# Patient Record
Sex: Male | Born: 1965 | Race: White | Hispanic: No | Marital: Married | Smoking: Former smoker
Health system: Southern US, Community
[De-identification: ages and names within clinical notes are randomized; demographics above are authoritative.]

## PROBLEM LIST (undated history)

## (undated) DIAGNOSIS — E785 Hyperlipidemia, unspecified: Secondary | ICD-10-CM

## (undated) DIAGNOSIS — H269 Unspecified cataract: Secondary | ICD-10-CM

## (undated) DIAGNOSIS — G4730 Sleep apnea, unspecified: Secondary | ICD-10-CM

## (undated) HISTORY — DX: Sleep apnea, unspecified: G47.30

## (undated) HISTORY — PX: OTHER SURGICAL HISTORY: SHX169

## (undated) HISTORY — PX: CATARACT EXTRACTION: SUR2

## (undated) HISTORY — DX: Hyperlipidemia, unspecified: E78.5

## (undated) HISTORY — PX: POLYPECTOMY: SHX149

## (undated) HISTORY — PX: UPPER GASTROINTESTINAL ENDOSCOPY: SHX188

## (undated) HISTORY — DX: Unspecified cataract: H26.9

---

## 2015-12-04 HISTORY — PX: COLONOSCOPY: SHX174

## 2015-12-04 LAB — HM COLONOSCOPY

## 2018-08-19 ENCOUNTER — Encounter: Payer: Self-pay | Admitting: Gastroenterology

## 2018-09-14 ENCOUNTER — Encounter: Payer: Self-pay | Admitting: Gastroenterology

## 2018-09-15 ENCOUNTER — Ambulatory Visit (INDEPENDENT_AMBULATORY_CARE_PROVIDER_SITE_OTHER): Payer: 59 | Admitting: Gastroenterology

## 2018-09-15 ENCOUNTER — Encounter: Payer: Self-pay | Admitting: Gastroenterology

## 2018-09-15 VITALS — BP 148/90 | HR 85 | Ht 75.0 in | Wt 277.8 lb

## 2018-09-15 DIAGNOSIS — K219 Gastro-esophageal reflux disease without esophagitis: Secondary | ICD-10-CM | POA: Diagnosis not present

## 2018-09-15 DIAGNOSIS — R1310 Dysphagia, unspecified: Secondary | ICD-10-CM | POA: Diagnosis not present

## 2018-09-15 MED ORDER — PANTOPRAZOLE SODIUM 40 MG PO TBEC: 40.0000 mg | DELAYED_RELEASE_TABLET | Freq: Every day | ORAL | 3 refills | Status: DC

## 2018-09-15 NOTE — Patient Instructions (Signed)
If you are age 53 or older, your body mass index should be between 23-30. Your Body mass index is 34.72 kg/m. If this is out of the aforementioned range listed, please consider follow up with your Primary Care Provider.  If you are age 78 or younger, your body mass index should be between 19-25. Your Body mass index is 34.72 kg/m. If this is out of the aformentioned range listed, please consider follow up with your Primary Care Provider.   You have been scheduled for a Barium Esophogram at [facility] Radiology (1st floor of the hospital) on 10/27/18 at 11am. Please arrive 15 minutes prior to your appointment for registration. Make certain not to have anything to eat or drink 3 hours prior to your test. If you need to reschedule for any reason, please contact radiology at [phone] to do so. __________________________________________________________________ A barium swallow is an examination that concentrates on views of the esophagus. This tends to be a double contrast exam (barium and two liquids which, when combined, create a gas to distend the wall of the oesophagus) or single contrast (non-ionic iodine based). The study is usually tailored to your symptoms so a good history is essential. Attention is paid during the study to the form, structure and configuration of the esophagus, looking for functional disorders (such as aspiration, dysphagia, achalasia, motility and reflux) EXAMINATION You may be asked to change into a gown, depending on the type of swallow being performed. A radiologist and radiographer will perform the procedure. The radiologist will advise you of the type of contrast selected for your procedure and direct you during the exam. You will be asked to stand, sit or lie in several different positions and to hold a small amount of fluid in your mouth before being asked to swallow while the imaging is performed .In some instances you may be asked to swallow barium coated marshmallows  to assess the motility of a solid food bolus. The exam can be recorded as a digital or video fluoroscopy procedure. POST PROCEDURE It will take 1-2 days for the barium to pass through your system. To facilitate this, it is important, unless otherwise directed, to increase your fluids for the next 24-48hrs and to resume your normal diet.  This test typically takes about 30 minutes to perform. __________________________________________________________________________________   Joshua Crawford have been scheduled for an endoscopy. Please follow written instructions given to you at your visit today. If you use inhalers (even only as needed), please bring them with you on the day of your procedure. Your physician has requested that you go to www.startemmi.com and enter the access code given to you at your visit today. This web site gives a general overview about your procedure. However, you should still follow specific instructions given to you by our office regarding your preparation for the procedure.  Thank you,  Dr. Jackquline Denmark

## 2018-09-15 NOTE — Progress Notes (Signed)
Chief Complaint: Esophageal dysphagia  Referring Provider:  Darrol Jump, PA-C      ASSESSMENT AND PLAN;   #1.  GERD   #2. Esophageal dysphagia. Differential diagnoses includes esophageal stricture, Schatzki's ring, motility disorder, eosinophilic esophagitis, pill induced esophagitis, rule out esophageal carcinoma or extrinsic lesions. Plan: - Ba swallow with barium tablet - Protonix 40mg  po qd. - EGD with dilatation - I have instructed patient that she needs to chew foods especially meats and breads well and eat slowly.     HPI:    Joshua Crawford is a 52 y.o. male  Intermittent solid food dysphagia mainly to beef over the last 2 years Mainly in the upper chest With occasional heartburn and regurgitation. Heartburn intermittently has been for the last several years. No weight loss, hemoptysis No problems with liquids No nausea or vomiting  Diagnosed with sleep apnea 2 years ago-has been on CPAP Had colonoscopy as a part of colorectal cancer screening December 2016.  Hyperplastic colonic polyp.  Repeat in 10 years.  Earlier, if any new problems  Past Surgical History:  Procedure Laterality Date  . CATARACT EXTRACTION    . COLONOSCOPY  12/04/2015   Colonic polyp status post polypectomy. Small internal hemorrhoids. Hypoerplastic polyp.   . Left Hand/Wrist Fracture w/ surgical repair    . Right Knee Arthroscopy    . Right Rotator Cuff Repair      Family History  Problem Relation Age of Onset  . Ovarian cancer Mother   . Lymphoma Sister   . Liver cancer Paternal Uncle     Social History   Tobacco Use  . Smoking status: Former Research scientist (life sciences)  . Smokeless tobacco: Never Used  Substance Use Topics  . Alcohol use: Yes    Comment: 0-3 daily  . Drug use: Never    No current outpatient medications on file.   No current facility-administered medications for this visit.     Allergies  Allergen Reactions  . Aleve [Naproxen Sodium] Anaphylaxis    Review of  Systems:  Constitutional: Denies fever, chills, diaphoresis, appetite change and fatigue.  HEENT: Denies photophobia, eye pain, redness, hearing loss, ear pain, congestion, sore throat, rhinorrhea, sneezing, mouth sores, neck pain, neck stiffness and tinnitus.   Respiratory: Denies SOB, DOE, cough, chest tightness,  and wheezing.   Cardiovascular: Denies chest pain, palpitations and leg swelling.  Genitourinary: Denies dysuria, urgency, frequency, hematuria, flank pain and difficulty urinating.  Musculoskeletal: Denies myalgias, has back pain, no joint swelling, arthralgias and gait problem.  Skin: No rash.  Neurological: Denies dizziness, seizures, syncope, weakness, light-headedness, numbness and headaches.  Hematological: Denies adenopathy. Easy bruising, personal or family bleeding history  Psychiatric/Behavioral: No anxiety or depression     Physical Exam:    BP (!) 148/90   Pulse 85   Ht 6\' 3"  (1.905 m)   Wt 277 lb 12.8 oz (126 kg)   BMI 34.72 kg/m  Filed Weights   09/15/18 0905  Weight: 277 lb 12.8 oz (126 kg)   Constitutional:  Well-developed, in no acute distress. Psychiatric: Normal mood and affect. Behavior is normal. HEENT: Pupils normal.  Conjunctivae are normal. No scleral icterus. Neck supple.  Cardiovascular: Normal rate, regular rhythm. No edema Pulmonary/chest: Effort normal and breath sounds normal. No wheezing, rales or rhonchi. Abdominal: Soft, nondistended. Nontender. Bowel sounds active throughout. There are no masses palpable. No hepatomegaly. Rectal:  defered Neurological: Alert and oriented to person place and time. Skin: Skin is warm and dry. No rashes noted.  Carmell Austria, MD 09/15/2018, 9:14 AM  Cc: Darrol Jump, PA-C

## 2018-09-29 ENCOUNTER — Ambulatory Visit (HOSPITAL_COMMUNITY)
Admission: RE | Admit: 2018-09-29 | Discharge: 2018-09-29 | Disposition: A | Discharge status: Home / Self Care | Payer: 59 | Source: Ambulatory Visit | Attending: Gastroenterology | Admitting: Gastroenterology

## 2018-09-29 DIAGNOSIS — R1310 Dysphagia, unspecified: Secondary | ICD-10-CM | POA: Insufficient documentation

## 2018-09-29 DIAGNOSIS — K219 Gastro-esophageal reflux disease without esophagitis: Secondary | ICD-10-CM | POA: Insufficient documentation

## 2018-10-13 ENCOUNTER — Encounter: Payer: Self-pay | Admitting: Gastroenterology

## 2018-10-27 ENCOUNTER — Encounter: Payer: Self-pay | Admitting: Gastroenterology

## 2018-10-27 ENCOUNTER — Ambulatory Visit (AMBULATORY_SURGERY_CENTER): Payer: 59 | Admitting: Gastroenterology

## 2018-10-27 VITALS — BP 123/89 | HR 75 | Temp 97.8°F | Resp 18 | Ht 75.0 in | Wt 277.0 lb

## 2018-10-27 DIAGNOSIS — R1319 Other dysphagia: Secondary | ICD-10-CM

## 2018-10-27 DIAGNOSIS — K2970 Gastritis, unspecified, without bleeding: Secondary | ICD-10-CM | POA: Diagnosis not present

## 2018-10-27 DIAGNOSIS — K200 Eosinophilic esophagitis: Secondary | ICD-10-CM | POA: Diagnosis not present

## 2018-10-27 MED ORDER — SODIUM CHLORIDE 0.9 % IV SOLN: 500.0000 mL | Freq: Once | INTRAVENOUS | Status: DC

## 2018-10-27 NOTE — Op Note (Signed)
[facility] Patient Name: Joshua Crawford Procedure Date: 10/27/2018 3:38 PM MRN: [id number] Endoscopist: Jackquline Denmark , MD Age: 52 Referring MD:  Date of Birth: Jun 21, 1966 Gender: Male Account #: [id number] Procedure:                Upper GI endoscopy Indications:              Dysphagia Medicines:                Monitored Anesthesia Care Procedure:                Pre-Anesthesia Assessment:                           - Prior to the procedure, a History and Physical                            was performed, and patient medications and                            allergies were reviewed. The patient's tolerance of                            previous anesthesia was also reviewed. The risks                            and benefits of the procedure and the sedation                            options and risks were discussed with the patient.                            All questions were answered, and informed consent                            was obtained. Prior Anticoagulants: The patient has                            taken no previous anticoagulant or antiplatelet                            agents. ASA Grade Assessment: II - A patient with                            mild systemic disease. After reviewing the risks                            and benefits, the patient was deemed in                            satisfactory condition to undergo the procedure.                           After obtaining informed consent, the endoscope was  passed under direct vision. Throughout the                            procedure, the patient's blood pressure, pulse, and                            oxygen saturations were monitored continuously. The                            Endoscope was introduced through the mouth, and                            advanced to the second part of duodenum. The upper                            GI endoscopy was accomplished without difficulty.                             The patient tolerated the procedure well. Findings:                 Mucosal changes including feline appearance and                            longitudinal furrows were found in the entire                            esophagus. Biopsies were obtained from the proximal                            and distal esophagus with cold forceps for                            histology of suspected eosinophilic esophagitis.                            Estimated blood loss was minimal. The scope was                            withdrawn. Dilation was performed with a Maloney                            dilator with no resistance at 50 Fr. Estimated                            blood loss: none.                           Mild inflammation was found in the gastric antrum.                            Biopsies were taken with a cold forceps for                            histology.  The examined duodenum was normal. Complications:            No immediate complications. Estimated Blood Loss:     Estimated blood loss: none. Impression:               - Esophageal mucosal changes suggestive of                            eosinophilic esophagitis. Biopsied. Dilated.                           - Mild gastritis. Biopsied. Recommendation:           - Post dilatation diet.                           - Continue present medications. Continue Protonix                            40 mg p.o. once a day.                           - Await pathology results.                           - Return to GI clinic PRN. Jackquline Denmark, MD 10/27/2018 4:11:26 PM This report has been signed electronically.

## 2018-10-27 NOTE — Progress Notes (Signed)
To PACU, VSS. Report to [email or web address] 

## 2018-10-27 NOTE — Patient Instructions (Signed)
Handouts given on dilation diet, gastritis, esophagitis and stricture, GERD protocol.    YOU HAD AN ENDOSCOPIC PROCEDURE TODAY AT [facility]:   Refer to the procedure report that was given to you for any specific questions about what was found during the examination.  If the procedure report does not answer your questions, please call your gastroenterologist to clarify.  If you requested that your care partner not be given the details of your procedure findings, then the procedure report has been included in a sealed envelope for you to review at your convenience later.  YOU SHOULD EXPECT: Some feelings of bloating in the abdomen. Passage of more gas than usual.  Walking can help get rid of the air that was put into your GI tract during the procedure and reduce the bloating. If you had a lower endoscopy (such as a colonoscopy or flexible sigmoidoscopy) you may notice spotting of blood in your stool or on the toilet paper. If you underwent a bowel prep for your procedure, you may not have a normal bowel movement for a few days.  Please Note:  You might notice some irritation and congestion in your nose or some drainage.  This is from the oxygen used during your procedure.  There is no need for concern and it should clear up in a day or so.  SYMPTOMS TO REPORT IMMEDIATELY:    Following upper endoscopy (EGD)  Vomiting of blood or coffee ground material  New chest pain or pain under the shoulder blades  Painful or persistently difficult swallowing  New shortness of breath  Fever of 100F or higher  Black, tarry-looking stools  For urgent or emergent issues, a gastroenterologist can be reached at any hour by calling [phone].   DIET:  We do recommend a small meal at first, but then you may proceed to your regular diet.  Drink plenty of fluids but you should avoid alcoholic beverages for 24 hours.  ACTIVITY:  You should plan to take it easy for the rest of today and you  should NOT DRIVE or use heavy machinery until tomorrow (because of the sedation medicines used during the test).    FOLLOW UP: Our staff will call the number listed on your records the next business day following your procedure to check on you and address any questions or concerns that you may have regarding the information given to you following your procedure. If we do not reach you, we will leave a message.  However, if you are feeling well and you are not experiencing any problems, there is no need to return our call.  We will assume that you have returned to your regular daily activities without incident.  If any biopsies were taken you will be contacted by phone or by letter within the next 1-3 weeks.  Please call us at [phone] if you have not heard about the biopsies in 3 weeks.    SIGNATURES/CONFIDENTIALITY: You and/or your care partner have signed paperwork which will be entered into your electronic medical record.  These signatures attest to the fact that that the information above on your After Visit Summary has been reviewed and is understood.  Full responsibility of the confidentiality of this discharge information lies with you and/or your care-partner.

## 2018-10-27 NOTE — Progress Notes (Signed)
Called to room to assist during endoscopic procedure.  Patient ID and intended procedure confirmed with present staff. Received instructions for my participation in the procedure from the performing physician.  

## 2018-10-28 ENCOUNTER — Telehealth: Payer: Self-pay | Admitting: *Deleted

## 2018-10-28 NOTE — Telephone Encounter (Signed)
  Follow up Call-  Call back number 10/27/2018  Post procedure Call Back phone  # [phone]  Permission to leave phone message Yes     Patient questions:  Do you have a fever, pain , or abdominal swelling? No. Pain Score  0 *  Have you tolerated food without any problems? Yes.    Have you been able to return to your normal activities? Yes.    Do you have any questions about your discharge instructions: Diet   No. Medications  No. Follow up visit  No.  Do you have questions or concerns about your Care? No.  Actions: * If pain score is 4 or above: No action needed, pain <4.

## 2018-11-02 ENCOUNTER — Encounter: Payer: Self-pay | Admitting: Gastroenterology

## 2018-11-03 ENCOUNTER — Other Ambulatory Visit: Payer: Self-pay

## 2018-11-03 DIAGNOSIS — A048 Other specified bacterial intestinal infections: Secondary | ICD-10-CM

## 2018-11-03 MED ORDER — CLARITHROMYCIN 500 MG PO TABS: 500.0000 mg | ORAL_TABLET | Freq: Two times a day (BID) | ORAL | 0 refills | Status: DC

## 2018-11-03 MED ORDER — PANTOPRAZOLE SODIUM 40 MG PO TBEC: 40.0000 mg | DELAYED_RELEASE_TABLET | Freq: Two times a day (BID) | ORAL | 0 refills | Status: DC

## 2018-11-03 MED ORDER — AMOXICILLIN 500 MG PO TABS: 1000.0000 mg | ORAL_TABLET | Freq: Two times a day (BID) | ORAL | 0 refills | Status: DC

## 2018-11-03 MED ORDER — METRONIDAZOLE 500 MG PO TABS: 500.0000 mg | ORAL_TABLET | Freq: Two times a day (BID) | ORAL | 0 refills | Status: DC

## 2018-11-18 ENCOUNTER — Telehealth: Payer: Self-pay | Admitting: Gastroenterology

## 2018-11-18 NOTE — Telephone Encounter (Signed)
Called and spoke with patient- patient reports he has finished all of the antibiotic and was told he had to give a stool sample; order in Epic already for H. pylori; patient informed he could come to the [facility] basement level to complete the lab work requested by MD; patient verbalized understanding of instructions and information; patient advise to call back if questions/concerns arise;

## 2018-11-18 NOTE — Telephone Encounter (Signed)
Pt called to inform that he finished treatment for H-pylori yesterday, He wants to schedule stool study.

## 2018-11-23 ENCOUNTER — Other Ambulatory Visit: Payer: 59

## 2018-11-23 DIAGNOSIS — A048 Other specified bacterial intestinal infections: Secondary | ICD-10-CM

## 2018-11-25 LAB — H. PYLORI ANTIGEN, STOOL: H pylori Ag, Stl: NEGATIVE

## 2020-02-21 ENCOUNTER — Telehealth: Payer: Self-pay | Admitting: Gastroenterology

## 2020-03-04 ENCOUNTER — Encounter: Payer: Self-pay | Admitting: Gastroenterology

## 2020-03-04 NOTE — Telephone Encounter (Signed)
Per Dr. Lyndel Safe after reviewing last colonoscopy report, patient is due December 2021. I have called and left a message for patient.

## 2020-08-16 IMAGING — RF DG ESOPHAGUS
9 series · 21 of 24 positions shown · non-contrast
Comparison: None.

CLINICAL DATA: Unspecified dysphagia

EXAM:
ESOPHOGRAM / BARIUM SWALLOW / BARIUM TABLET STUDY
TECHNIQUE: Combined double contrast and single contrast examination performed
using effervescent crystals, thick barium liquid, and thin barium
liquid. The patient was observed with fluoroscopy swallowing a 13 mm
barium sulphate tablet.
FLUOROSCOPY TIME:  Fluoroscopy Time:  1.3 minutes
Radiation Exposure Index (if provided by the fluoroscopic device):
14.4 mGy
Number of Acquired Spot Images: 0

[Series 1: cp_standard · 0.34mm/px · 3 of 38 frames shown (1 of 9)]
[frame 6/38]
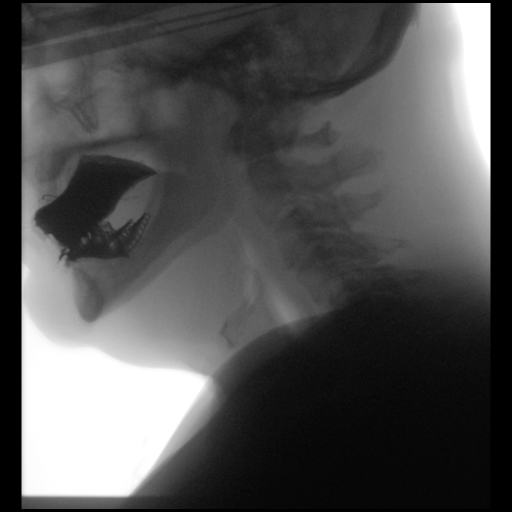
[frame 33/38]
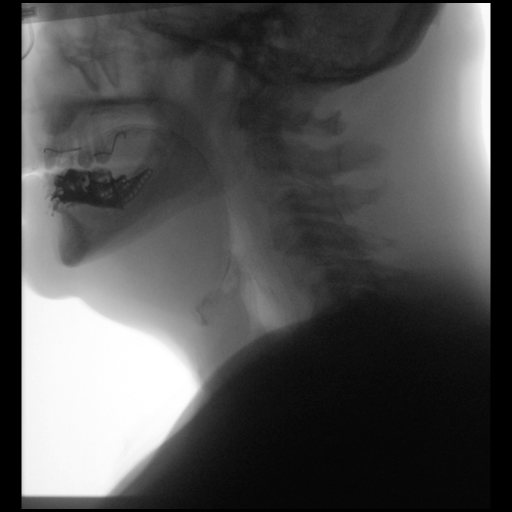
[frame 34/38]
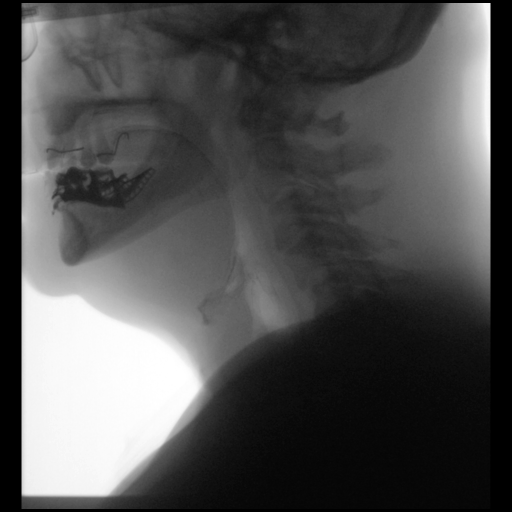

[Series 2: cp_standard · 0.34mm/px · 1 of 53 frames shown (2 of 9)]
[frame 27/53]
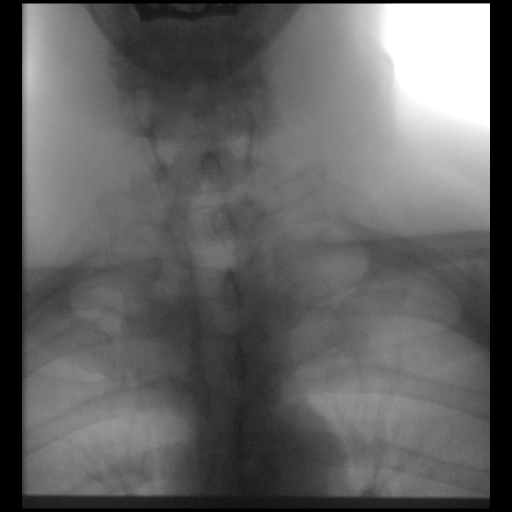

[Series 3: cp_standard · 0.51mm/px · 3 of 22 frames shown (3 of 9)]
[frame 4/22]
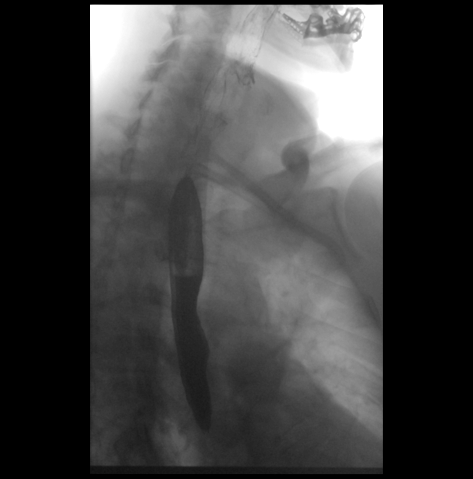
[frame 7/22]
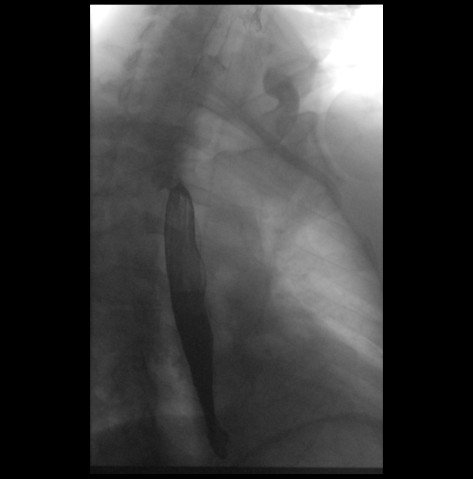
[frame 19/22]
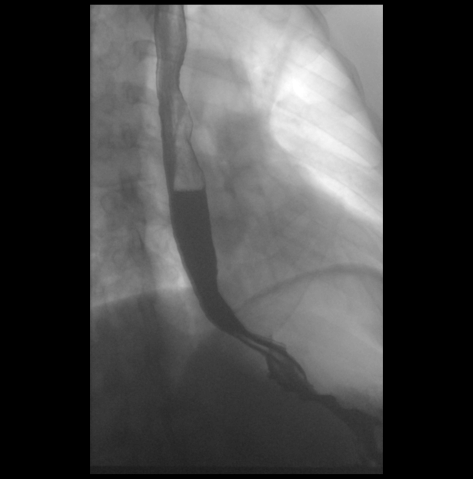

[Series 4: cp_standard · 0.34mm/px · 3 of 14 frames shown (4 of 9)]
[frame 3/14]
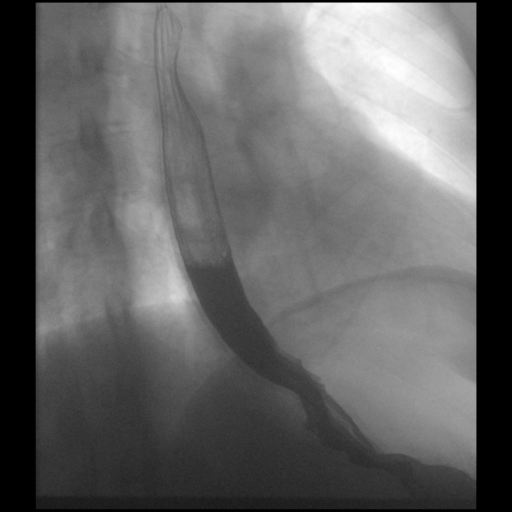
[frame 8/14]
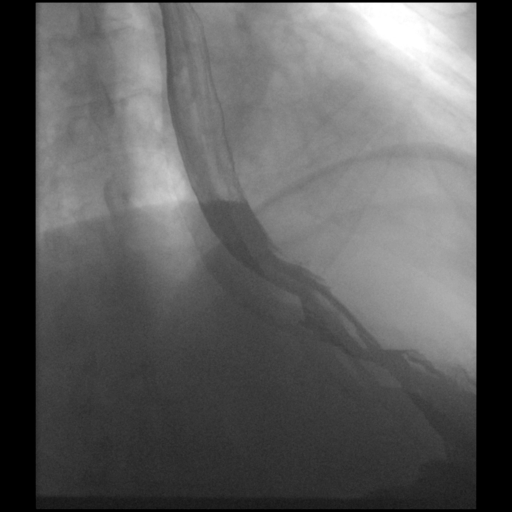
[frame 12/14]
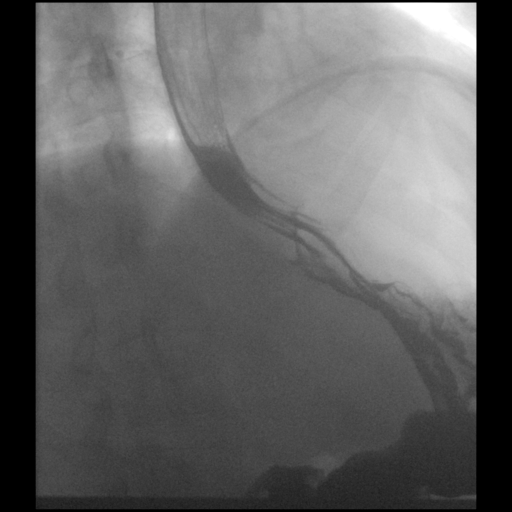

[Series 5: cp_standard · 0.34mm/px · 1 of 22 frames shown (5 of 9)]
[frame 12/22]
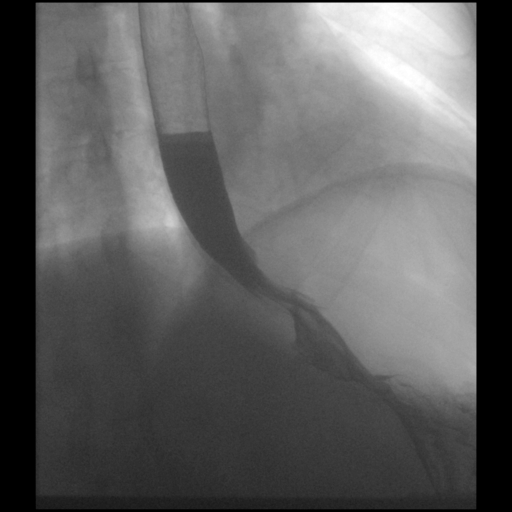

[Series 6: cp_standard · 0.34mm/px · 3 of 5 frames shown (6 of 9)]
[frame 1/5]
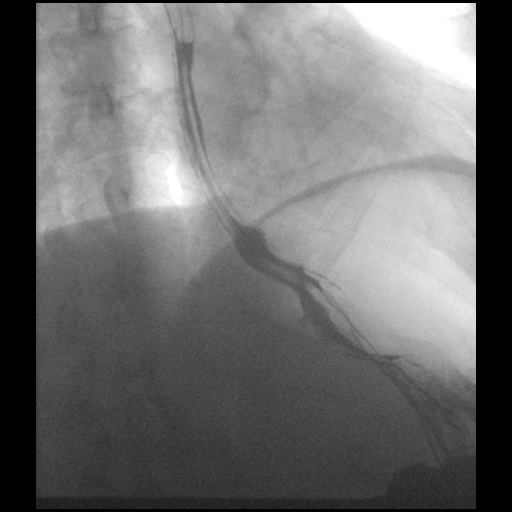
[frame 2/5]
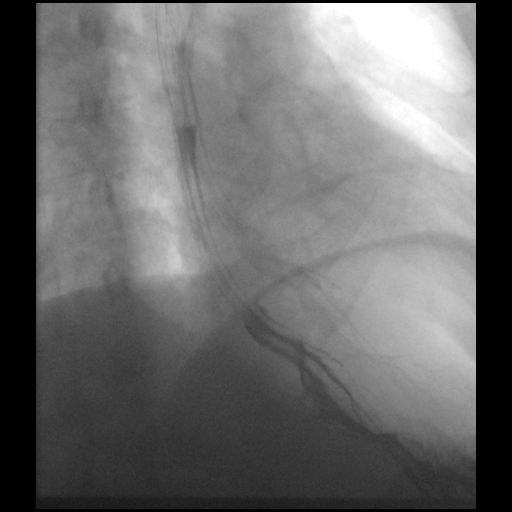
[frame 5/5]
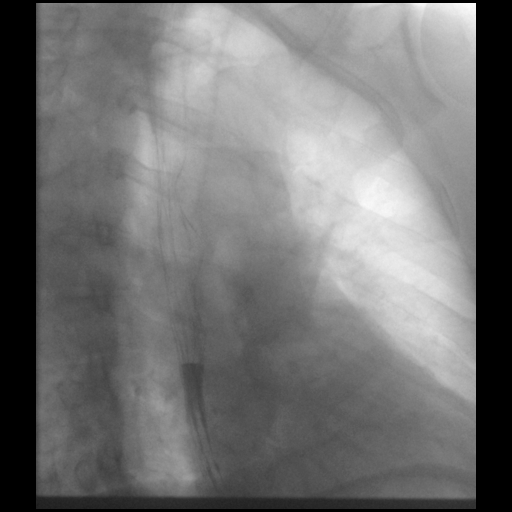

[Series 7: cp_standard · 0.53mm/px · 3 of 31 frames shown (7 of 9)]
[frame 3/31]
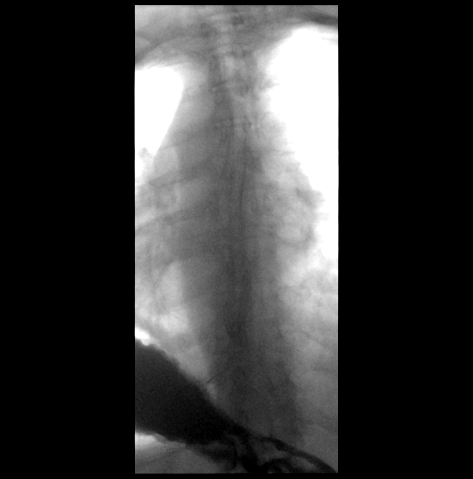
[frame 16/31]
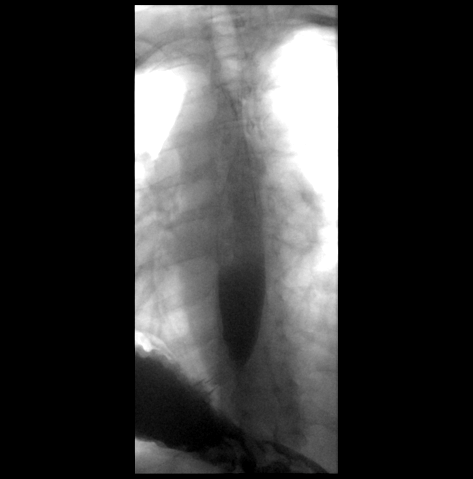
[frame 27/31]
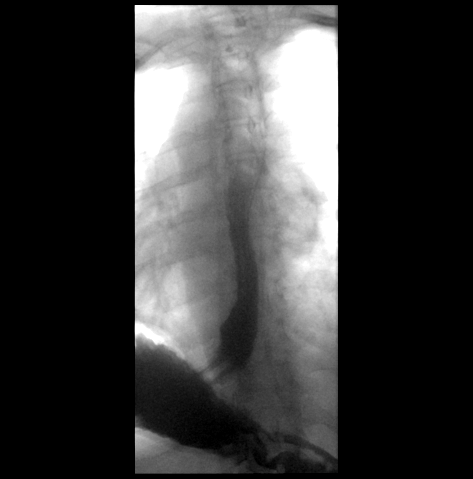

[Series 8: cp_standard · 0.53mm/px · 1 of 21 frames shown (8 of 9)]
[frame 18/21]
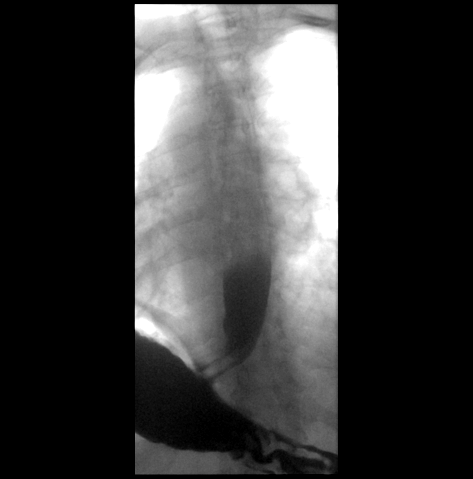

[Series 9: cp_standard · 0.54mm/px · 3 of 30 frames shown (9 of 9)]
[frame 5/30]
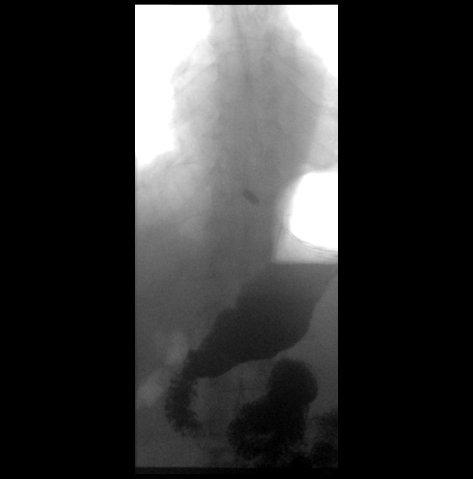
[frame 16/30]
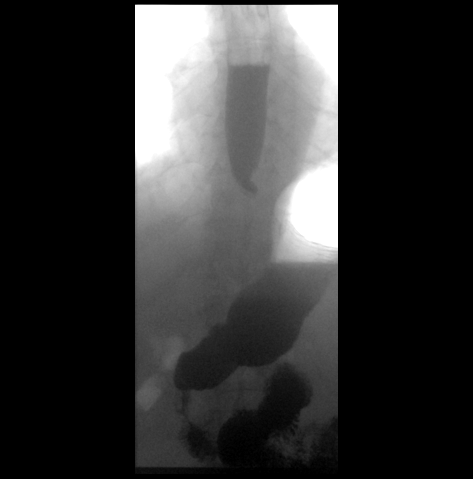
[frame 28/30]
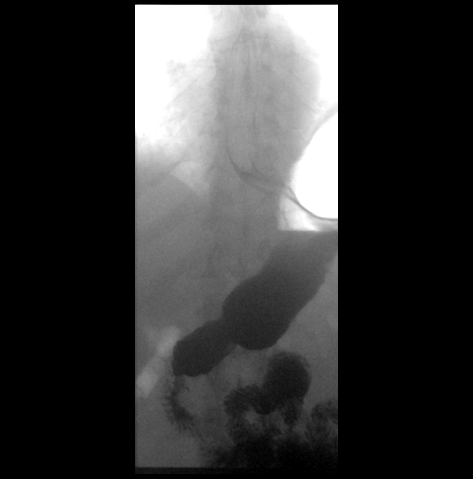

[21 of 24 positions shown; findings below may reference images not displayed]

FINDINGS: Good oral coordination. There is flash laryngeal penetration. No
stasis. No obstructive process is seen. There is asymmetric right
lateral pharyngeal outpouching without fixed diverticulum.

The esophagus has normal distensibility and smooth mucosal contour.
Normal motility. No hiatal hernia.

A 13 mm barium tablet transiently arrested at the GE junction,
without evidence of underlying cause. This eventually passed with
swallows.
IMPRESSION: Transient delay of 13 mm barium tablet at the GE junction, which
reproduces symptoms, but is not associated with a fixed narrowing or
dysmotility.

## 2020-10-02 ENCOUNTER — Other Ambulatory Visit: Payer: Self-pay

## 2020-10-02 ENCOUNTER — Ambulatory Visit (INDEPENDENT_AMBULATORY_CARE_PROVIDER_SITE_OTHER): Payer: 59 | Admitting: Family Medicine

## 2020-10-02 ENCOUNTER — Encounter: Payer: Self-pay | Admitting: Family Medicine

## 2020-10-02 VITALS — BP 128/78 | HR 96 | Temp 98.7°F | Ht 75.0 in | Wt 288.0 lb

## 2020-10-02 DIAGNOSIS — M545 Low back pain, unspecified: Secondary | ICD-10-CM

## 2020-10-02 DIAGNOSIS — E119 Type 2 diabetes mellitus without complications: Secondary | ICD-10-CM | POA: Insufficient documentation

## 2020-10-02 DIAGNOSIS — E669 Obesity, unspecified: Secondary | ICD-10-CM | POA: Insufficient documentation

## 2020-10-02 DIAGNOSIS — I152 Hypertension secondary to endocrine disorders: Secondary | ICD-10-CM

## 2020-10-02 DIAGNOSIS — E1169 Type 2 diabetes mellitus with other specified complication: Secondary | ICD-10-CM | POA: Diagnosis not present

## 2020-10-02 DIAGNOSIS — E785 Hyperlipidemia, unspecified: Secondary | ICD-10-CM | POA: Diagnosis not present

## 2020-10-02 DIAGNOSIS — E1159 Type 2 diabetes mellitus with other circulatory complications: Secondary | ICD-10-CM | POA: Insufficient documentation

## 2020-10-02 HISTORY — DX: Low back pain, unspecified: M54.50

## 2020-10-02 HISTORY — DX: Type 2 diabetes mellitus with other specified complication: I15.2

## 2020-10-02 HISTORY — DX: Type 2 diabetes mellitus with other circulatory complications: E66.9

## 2020-10-02 MED ORDER — CYCLOBENZAPRINE HCL 10 MG PO TABS: 10.0000 mg | ORAL_TABLET | Freq: Every day | ORAL | 0 refills | Status: DC

## 2020-10-02 MED ORDER — ATORVASTATIN CALCIUM 10 MG PO TABS: 10.0000 mg | ORAL_TABLET | Freq: Every day | ORAL | 1 refills | Status: DC

## 2020-10-02 NOTE — Progress Notes (Signed)
Acute Office Visit  Subjective:    Patient ID: Joshua Crawford, male    DOB: 1966-06-20, 54 y.o.   MRN: [id number]  Chief Complaint  Patient presents with  . LOW BACK PAIN    HPI Patient is in today for back pain-pt reached in drawer to get a pair of socks and his back popped(yesterday). Pt with acute pain-pt states laid on bed then he was able to get up and walk around. Pt states no radiation of pain into the legs. NO burning pain. Pt able to sleep laying down last night. Wife called on-line doctor from insurance and they called in a dose pack for treatment. Pt took 6 last night. No h/o of ulcer disease or hypertension(dose pack -taper)-pt states he just took them at the same time.   Pt states he had an injury to his back 20 years ago-workers comp. Pt feel and landed on his head. Pt seen by surgery who gave him 50/50 chance of successful pain free post operation -he decided not to undergo surgery. MRI on his back 20 years ago-no findings. No numbness or tingling noted with his back pain. Pt states he took a muscle relaxer in the past-pt has not taken on a regular basis.  pts wife gave him one of her muscle relaxers last night. Unsure on name-? Flexeril-5 mgs. . 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 Pt states he does not complete heavy lifting at work.   Pt off cholesterol medications-stopped 6 months ago Pt due to PE in 3 weeks-needs blood work-none recently   Past Medical History:  Diagnosis Date  . Hyperlipidemia     Past Surgical History:  Procedure Laterality Date  . CATARACT EXTRACTION    . COLONOSCOPY  12/04/2015   Colonic polyp status post polypectomy. Small internal hemorrhoids. Hypoerplastic polyp.   . Left Hand/Wrist Fracture w/ surgical repair    . Right Knee Arthroscopy    . Right Rotator Cuff Repair      Family History  Problem Relation Age of Onset  . Ovarian cancer Mother   . Lymphoma Sister   . Liver cancer Paternal  Uncle     Social History   Socioeconomic History  . Marital status: Married    Spouse name: Not on file  . Number of children: Not on file  . Years of education: Not on file  . Highest education level: Not on file  Occupational History  . Occupation: unemployed  Tobacco Use  . Smoking status: Former Research scientist (life sciences)  . Smokeless tobacco: Never Used  Vaping Use  . Vaping Use: Never used  Substance and Sexual Activity  . Alcohol use: Yes    Comment: 0-3 daily  . Drug use: Never  . Sexual activity: Not on file  Other Topics Concern  . Not on file  Social History Narrative  . Not on file   Social Determinants of Health   Financial Resource Strain:   . Difficulty of Paying Living Expenses: Not on file  Food Insecurity:   . Worried About Charity fundraiser in the Last Year: Not on file  . Ran Out of Food in the Last Year: Not on file  Transportation Needs:   . Lack of Transportation (Medical): Not on file  . Lack of Transportation (Non-Medical): Not on file  Physical Activity:   . Days of Exercise per Week: Not on file  . Minutes of Exercise per Session: Not on file  Stress:   . Feeling of Stress : Not on file  Social Connections:   . Frequency of Communication with Friends and Family: Not on file  . Frequency of Social Gatherings with Friends and Family: Not on file  . Attends Religious Services: Not on file  . Active Member of Clubs or Organizations: Not on file  . Attends Archivist Meetings: Not on file  . Marital Status: Not on file  Intimate Partner Violence:   . Fear of Current or Ex-Partner: Not on file  . Emotionally Abused: Not on file  . Physically Abused: Not on file  . Sexually Abused: Not on file    Outpatient Medications Prior to Visit  Medication Sig Dispense Refill  . methylPREDNISolone (MEDROL DOSEPAK) 4 MG TBPK tablet Take 4 mg by mouth.    . pantoprazole (PROTONIX) 40 MG tablet Take 1 tablet (40 mg total) by mouth 2 (two) times daily. 28  tablet 0  . amoxicillin (AMOXIL) 500 MG tablet Take 2 tablets (1,000 mg total) by mouth 2 (two) times daily. 56 tablet 0  . atorvastatin (LIPITOR) 10 MG tablet Take 10 mg by mouth daily.    . clarithromycin (BIAXIN) 500 MG tablet Take 1 tablet (500 mg total) by mouth 2 (two) times daily. 28 tablet 0  . metroNIDAZOLE (FLAGYL) 500 MG tablet Take 1 tablet (500 mg total) by mouth 2 (two) times daily. 28 tablet 0  . pantoprazole (PROTONIX) 40 MG tablet Take 1 tablet (40 mg total) by mouth daily. 30 tablet 3   No facility-administered medications prior to visit.    Allergies  Allergen Reactions  . Aleve [Naproxen Sodium] Anaphylaxis    Review of Systems  Constitutional: Negative.   Respiratory: Negative.   Cardiovascular: Negative.   Gastrointestinal: Negative.   Musculoskeletal: Positive for back pain.       Used icy hot patch on back Throbbing pain-central-no radiation-no change in BM       Objective:    Physical Exam Vitals reviewed.  Constitutional:      Appearance: Normal appearance.  HENT:     Head: Normocephalic and atraumatic.  Cardiovascular:     Rate and Rhythm: Normal rate and regular rhythm.     Pulses: Normal pulses.     Heart sounds: Normal heart sounds.  Pulmonary:     Effort: Pulmonary effort is normal.     Breath sounds: Normal breath sounds.  Musculoskeletal:        General: Tenderness present.     Comments: LROM back-limited able to flex/extend/rotate Normal gait  Neurological:     Mental Status: He is alert and oriented to person, place, and time.     Gait: Gait normal.     Deep Tendon Reflexes: Reflexes normal.     BP 128/78 (BP Location: Left Arm, Patient Position: Sitting)   Pulse 96   Temp 98.7 F (37.1 C) (Temporal)   Wt 288 lb (130.6 kg)   BMI 36.00 kg/m  Wt Readings from Last 3 Encounters:  10/02/20 288 lb (130.6 kg)  10/27/18 277 lb (125.6 kg)  09/15/18 277 lb 12.8 oz (126 kg)    Health Maintenance Due  Topic Date Due  .  Hepatitis C Screening  Never done  . COVID-19 Vaccine (1) Never done  . HIV Screening  Never done  . TETANUS/TDAP  Never done  . COLONOSCOPY  Never done  . INFLUENZA VACCINE  Never done       Assessment & Plan:  1. Hyperlipidemia associated with type 2 diabetes mellitus ([place]) Pt off cholesterol medications-restart lipitor-labwork today-f/u with physical as scheduled. - Lipid Panel With LDL/HDL Ratio - Comprehensive Metabolic Panel (CMET) - CBC with Differential - TSH  2. Acute midline low back pain without sciatica Tenderness-d/w pt heat/rest/stretches/flexeril-risk/benefit/side effects d/w pt - Lipid Panel With LDL/HDL Ratio - Comprehensive Metabolic Panel (CMET) - CBC with Differential - TSH   Phylisha Dix Hannah Beat, MD

## 2020-10-02 NOTE — Patient Instructions (Addendum)
Take 6 addition prednisone with supper today Take 4 addition prednisone with supper tomorrow Take 2 additional prednisone with supper Friday Take 1 additional prednisone with supper on Sat and Sun Take tylenol for additional pain -max is 3 grams/day Flexeril 10mg -Take at night prior to bedtime Use heat to the back 15/77min Call if no improvement or symptoms worsens

## 2020-10-03 ENCOUNTER — Encounter: Payer: Self-pay | Admitting: Family Medicine

## 2020-10-03 ENCOUNTER — Ambulatory Visit (INDEPENDENT_AMBULATORY_CARE_PROVIDER_SITE_OTHER): Payer: 59 | Admitting: Family Medicine

## 2020-10-03 VITALS — BP 132/78 | HR 96 | Temp 97.9°F | Wt 292.0 lb

## 2020-10-03 DIAGNOSIS — D72829 Elevated white blood cell count, unspecified: Secondary | ICD-10-CM | POA: Diagnosis not present

## 2020-10-03 DIAGNOSIS — R7301 Impaired fasting glucose: Secondary | ICD-10-CM | POA: Diagnosis not present

## 2020-10-03 DIAGNOSIS — E785 Hyperlipidemia, unspecified: Secondary | ICD-10-CM | POA: Diagnosis not present

## 2020-10-03 LAB — CBC WITH DIFFERENTIAL/PLATELET
Basophils Absolute: 0.1 10*3/uL (ref 0.0–0.2)
Basos: 1 %
EOS (ABSOLUTE): 0.2 10*3/uL (ref 0.0–0.4)
Eos: 2 %
Hematocrit: 46.9 % (ref 37.5–51.0)
Hemoglobin: 16.1 g/dL (ref 13.0–17.7)
Immature Grans (Abs): 0 10*3/uL (ref 0.0–0.1)
Immature Granulocytes: 0 %
Lymphocytes Absolute: 1.7 10*3/uL (ref 0.7–3.1)
Lymphs: 13 %
MCH: 30.1 pg (ref 26.6–33.0)
MCHC: 34.3 g/dL (ref 31.5–35.7)
MCV: 88 fL (ref 79–97)
Monocytes Absolute: 0.8 10*3/uL (ref 0.1–0.9)
Monocytes: 6 %
Neutrophils Absolute: 10.1 10*3/uL — ABNORMAL HIGH (ref 1.4–7.0)
Neutrophils: 78 %
Platelets: 207 10*3/uL (ref 150–450)
RBC: 5.35 x10E6/uL (ref 4.14–5.80)
RDW: 13.5 % (ref 11.6–15.4)
WBC: 12.9 10*3/uL — ABNORMAL HIGH (ref 3.4–10.8)

## 2020-10-03 LAB — COMPREHENSIVE METABOLIC PANEL
ALT: 28 IU/L (ref 0–44)
AST: 19 IU/L (ref 0–40)
Albumin/Globulin Ratio: 1.6 (ref 1.2–2.2)
Albumin: 4.6 g/dL (ref 3.8–4.9)
Alkaline Phosphatase: 68 IU/L (ref 44–121)
BUN/Creatinine Ratio: 14 (ref 9–20)
BUN: 13 mg/dL (ref 6–24)
Bilirubin Total: 0.4 mg/dL (ref 0.0–1.2)
CO2: 25 mmol/L (ref 20–29)
Calcium: 9.5 mg/dL (ref 8.7–10.2)
Chloride: 100 mmol/L (ref 96–106)
Creatinine, Ser: 0.93 mg/dL (ref 0.76–1.27)
GFR calc Af Amer: 107 mL/min/{1.73_m2} (ref >59)
GFR calc non Af Amer: 93 mL/min/{1.73_m2} (ref >59)
Globulin, Total: 2.8 g/dL (ref 1.5–4.5)
Glucose: 138 mg/dL — ABNORMAL HIGH (ref 65–99)
Potassium: 4.3 mmol/L (ref 3.5–5.2)
Sodium: 140 mmol/L (ref 134–144)
Total Protein: 7.4 g/dL (ref 6.0–8.5)

## 2020-10-03 LAB — LIPID PANEL WITH LDL/HDL RATIO
Cholesterol, Total: 237 mg/dL — ABNORMAL HIGH (ref 100–199)
HDL: 51 mg/dL (ref >39)
LDL Chol Calc (NIH): 160 mg/dL — ABNORMAL HIGH (ref 0–99)
LDL/HDL Ratio: 3.1 ratio (ref 0.0–3.6)
Triglycerides: 144 mg/dL (ref 0–149)
VLDL Cholesterol Cal: 26 mg/dL (ref 5–40)

## 2020-10-03 LAB — CARDIOVASCULAR RISK ASSESSMENT

## 2020-10-03 LAB — TSH: TSH: 2 u[IU]/mL (ref 0.450–4.500)

## 2020-10-03 NOTE — Progress Notes (Signed)
Acute Office Visit  Subjective:    Patient ID: Joshua Crawford, male    DOB: [birth year], 54 y.o.   MRN: [id number]  Chief Complaint  Patient presents with  . Disucuss labs    HPI Patient is in today for elevated glucose, elevated cholesterol and elevated WBC. Pt with no h/o DM. No h/o medications to lower cholesterol in the past  Past Medical History:  Diagnosis Date  . Hyperlipidemia     Past Surgical History:  Procedure Laterality Date  . CATARACT EXTRACTION    . COLONOSCOPY  12/04/2015   Colonic polyp status post polypectomy. Small internal hemorrhoids. Hypoerplastic polyp.   . Left Hand/Wrist Fracture w/ surgical repair    . Right Knee Arthroscopy    . Right Rotator Cuff Repair      Family History  Problem Relation Age of Onset  . Ovarian cancer Mother   . Lymphoma Sister   . Liver cancer Paternal Uncle     Social History   Socioeconomic History  . Marital status: Married    Spouse name: Not on file  . Number of children: Not on file  . Years of education: Not on file  . Highest education level: Not on file  Occupational History  . Occupation: unemployed  Tobacco Use  . Smoking status: Former Research scientist (life sciences)  . Smokeless tobacco: Never Used  Vaping Use  . Vaping Use: Never used  Substance and Sexual Activity  . Alcohol use: Yes    Comment: 0-3 daily  . Drug use: Never  . Sexual activity: Not on file  Other Topics Concern  . Not on file  Social History Narrative  . Not on file   Social Determinants of Health   Financial Resource Strain:   . Difficulty of Paying Living Expenses: Not on file  Food Insecurity:   . Worried About Charity fundraiser in the Last Year: Not on file  . Ran Out of Food in the Last Year: Not on file  Transportation Needs:   . Lack of Transportation (Medical): Not on file  . Lack of Transportation (Non-Medical): Not on file  Physical Activity:   . Days of Exercise per Week: Not on file  . Minutes of Exercise per Session: Not on  file  Stress:   . Feeling of Stress : Not on file  Social Connections:   . Frequency of Communication with Friends and Family: Not on file  . Frequency of Social Gatherings with Friends and Family: Not on file  . Attends Religious Services: Not on file  . Active Member of Clubs or Organizations: Not on file  . Attends Archivist Meetings: Not on file  . Marital Status: Not on file  Intimate Partner Violence:   . Fear of Current or Ex-Partner: Not on file  . Emotionally Abused: Not on file  . Physically Abused: Not on file  . Sexually Abused: Not on file    Outpatient Medications Prior to Visit  Medication Sig Dispense Refill  . atorvastatin (LIPITOR) 10 MG tablet Take 1 tablet (10 mg total) by mouth daily. 90 tablet 1  . cyclobenzaprine (FLEXERIL) 10 MG tablet Take 1 tablet (10 mg total) by mouth at bedtime. 30 tablet 0  . methylPREDNISolone (MEDROL DOSEPAK) 4 MG TBPK tablet Take 4 mg by mouth.    . pantoprazole (PROTONIX) 40 MG tablet Take 1 tablet (40 mg total) by mouth 2 (two) times daily. 28 tablet 0   No facility-administered medications prior to visit.  Allergies  Allergen Reactions  . Aleve [Naproxen Sodium] Anaphylaxis    Review of Systems  Constitutional: Negative for fatigue and fever.  Respiratory: Negative for cough.   Gastrointestinal: Negative for abdominal pain.  Endocrine: Negative for polyuria.  Genitourinary: Negative for dysuria and frequency.       Objective:    Physical Exam  BP 132/78 (BP Location: Left Arm, Patient Position: Sitting)   Pulse 96   Temp 97.9 F (36.6 C) (Temporal)   Wt 292 lb (132.5 kg)   SpO2 97%   BMI 36.50 kg/m  Wt Readings from Last 3 Encounters:  10/03/20 292 lb (132.5 kg)  10/02/20 288 lb (130.6 kg)  10/27/18 277 lb (125.6 kg)    Health Maintenance Due  Topic Date Due  . HEMOGLOBIN A1C  Never done  . Hepatitis C Screening  Never done  . PNEUMOCOCCAL POLYSACCHARIDE VACCINE AGE 57-64 HIGH RISK  Never  done  . FOOT EXAM  Never done  . OPHTHALMOLOGY EXAM  Never done  . URINE MICROALBUMIN  Never done  . COVID-19 Vaccine (1) Never done  . HIV Screening  Never done  . TETANUS/TDAP  Never done  . COLONOSCOPY  Never done  . INFLUENZA VACCINE  Never done     Lab Results  Component Value Date   TSH 2.000 10/02/2020   Lab Results  Component Value Date   WBC 12.9 (H) 10/02/2020   HGB 16.1 10/02/2020   HCT 46.9 10/02/2020   MCV 88 10/02/2020   PLT 207 10/02/2020   Lab Results  Component Value Date   NA 140 10/02/2020   K 4.3 10/02/2020   CO2 25 10/02/2020   GLUCOSE 138 (H) 10/02/2020   BUN 13 10/02/2020   CREATININE 0.93 10/02/2020   BILITOT 0.4 10/02/2020   ALKPHOS 68 10/02/2020   AST 19 10/02/2020   ALT 28 10/02/2020   PROT 7.4 10/02/2020   ALBUMIN 4.6 10/02/2020   CALCIUM 9.5 10/02/2020   Lab Results  Component Value Date   CHOL 237 (H) 10/02/2020   Lab Results  Component Value Date   HDL 51 10/02/2020   Lab Results  Component Value Date   LDLCALC 160 (H) 10/02/2020   Lab Results  Component Value Date   TRIG 144 10/02/2020   Assessment & Plan:  1. Impaired fasting glucose Concern for DM-d/w pt decrease in carbohydrates-consider dietitian appt - Hemoglobin A1c  2. Hyperlipidemia, unspecified hyperlipidemia type Avoid friend and fast foods, increase exercise-no medication started  3. Leukocytosis, unspecified type No cough, pain with urination, rash, abdominal pain-recheck cbc-d/w pt concerns Yunique Dearcos Hannah Beat, MD

## 2020-10-04 LAB — HEMOGLOBIN A1C
Est. average glucose Bld gHb Est-mCnc: 140 mg/dL
Hgb A1c MFr Bld: 6.5 % — ABNORMAL HIGH (ref 4.8–5.6)

## 2020-10-08 DIAGNOSIS — D72829 Elevated white blood cell count, unspecified: Secondary | ICD-10-CM | POA: Insufficient documentation

## 2020-10-08 DIAGNOSIS — R7301 Impaired fasting glucose: Secondary | ICD-10-CM | POA: Insufficient documentation

## 2020-10-08 DIAGNOSIS — E785 Hyperlipidemia, unspecified: Secondary | ICD-10-CM | POA: Insufficient documentation

## 2020-10-08 DIAGNOSIS — E782 Mixed hyperlipidemia: Secondary | ICD-10-CM | POA: Insufficient documentation

## 2020-10-08 HISTORY — DX: Elevated white blood cell count, unspecified: D72.829

## 2020-10-08 HISTORY — DX: Mixed hyperlipidemia: E78.2

## 2020-10-08 HISTORY — DX: Impaired fasting glucose: R73.01

## 2020-10-10 ENCOUNTER — Other Ambulatory Visit: Payer: 59

## 2020-10-10 ENCOUNTER — Other Ambulatory Visit: Payer: Self-pay

## 2020-10-10 DIAGNOSIS — D72829 Elevated white blood cell count, unspecified: Secondary | ICD-10-CM

## 2020-10-10 LAB — CBC WITH DIFFERENTIAL/PLATELET
Basophils Absolute: 0.1 10*3/uL (ref 0.0–0.2)
Basos: 1 %
EOS (ABSOLUTE): 0.2 10*3/uL (ref 0.0–0.4)
Eos: 2 %
Hematocrit: 45 % (ref 37.5–51.0)
Hemoglobin: 15.5 g/dL (ref 13.0–17.7)
Immature Grans (Abs): 0 10*3/uL (ref 0.0–0.1)
Immature Granulocytes: 0 %
Lymphocytes Absolute: 2.1 10*3/uL (ref 0.7–3.1)
Lymphs: 28 %
MCH: 30.2 pg (ref 26.6–33.0)
MCHC: 34.4 g/dL (ref 31.5–35.7)
MCV: 88 fL (ref 79–97)
Monocytes Absolute: 0.6 10*3/uL (ref 0.1–0.9)
Monocytes: 8 %
Neutrophils Absolute: 4.4 10*3/uL (ref 1.4–7.0)
Neutrophils: 61 %
Platelets: 172 10*3/uL (ref 150–450)
RBC: 5.13 x10E6/uL (ref 4.14–5.80)
RDW: 13.3 % (ref 11.6–15.4)
WBC: 7.3 10*3/uL (ref 3.4–10.8)

## 2020-10-21 ENCOUNTER — Encounter: Payer: Self-pay | Admitting: Legal Medicine

## 2020-10-21 ENCOUNTER — Other Ambulatory Visit: Payer: Self-pay

## 2020-10-21 ENCOUNTER — Ambulatory Visit (INDEPENDENT_AMBULATORY_CARE_PROVIDER_SITE_OTHER): Payer: 59 | Admitting: Legal Medicine

## 2020-10-21 VITALS — BP 128/68 | HR 90 | Temp 97.6°F | Resp 16 | Ht 75.0 in | Wt 283.2 lb

## 2020-10-21 DIAGNOSIS — Z23 Encounter for immunization: Secondary | ICD-10-CM

## 2020-10-21 DIAGNOSIS — E669 Obesity, unspecified: Secondary | ICD-10-CM

## 2020-10-21 DIAGNOSIS — Z0000 Encounter for general adult medical examination without abnormal findings: Secondary | ICD-10-CM

## 2020-10-21 DIAGNOSIS — E782 Mixed hyperlipidemia: Secondary | ICD-10-CM

## 2020-10-21 DIAGNOSIS — Z6835 Body mass index (BMI) 35.0-35.9, adult: Secondary | ICD-10-CM

## 2020-10-21 DIAGNOSIS — E119 Type 2 diabetes mellitus without complications: Secondary | ICD-10-CM

## 2020-10-21 DIAGNOSIS — I152 Hypertension secondary to endocrine disorders: Secondary | ICD-10-CM

## 2020-10-21 DIAGNOSIS — E1169 Type 2 diabetes mellitus with other specified complication: Secondary | ICD-10-CM

## 2020-10-21 DIAGNOSIS — Z1211 Encounter for screening for malignant neoplasm of colon: Secondary | ICD-10-CM | POA: Diagnosis not present

## 2020-10-21 DIAGNOSIS — E1159 Type 2 diabetes mellitus with other circulatory complications: Secondary | ICD-10-CM

## 2020-10-21 HISTORY — DX: Encounter for general adult medical examination without abnormal findings: Z00.00

## 2020-10-21 HISTORY — DX: Body mass index (BMI) 35.0-35.9, adult: Z68.35

## 2020-10-21 NOTE — Patient Instructions (Signed)
Preventive Care 41-55 Years Old, Male Preventive care refers to lifestyle choices and visits with your health care provider that can promote health and wellness. This includes:  A yearly physical exam. This is also called an annual well check.  Regular dental and eye exams.  Immunizations.  Screening for certain conditions.  Healthy lifestyle choices, such as eating a healthy diet, getting regular exercise, not using drugs or products that contain nicotine and tobacco, and limiting alcohol use. What can I expect for my preventive care visit? Physical exam Your health care provider will check:  Height and weight. These may be used to calculate body mass index (BMI), which is a measurement that tells if you are at a healthy weight.  Heart rate and blood pressure.  Your skin for abnormal spots. Counseling Your health care provider may ask you questions about:  Alcohol, tobacco, and drug use.  Emotional well-being.  Home and relationship well-being.  Sexual activity.  Eating habits.  Work and work Statistician. What immunizations do I need?  Influenza (flu) vaccine  This is recommended every year. Tetanus, diphtheria, and pertussis (Tdap) vaccine  You may need a Td booster every 10 years. Varicella (chickenpox) vaccine  You may need this vaccine if you have not already been vaccinated. Zoster (shingles) vaccine  You may need this after age 64. Measles, mumps, and rubella (MMR) vaccine  You may need at least one dose of MMR if you were born in 1957 or later. You may also need a second dose. Pneumococcal conjugate (PCV13) vaccine  You may need this if you have certain conditions and were not previously vaccinated. Pneumococcal polysaccharide (PPSV23) vaccine  You may need one or two doses if you smoke cigarettes or if you have certain conditions. Meningococcal conjugate (MenACWY) vaccine  You may need this if you have certain conditions. Hepatitis A  vaccine  You may need this if you have certain conditions or if you travel or work in places where you may be exposed to hepatitis A. Hepatitis B vaccine  You may need this if you have certain conditions or if you travel or work in places where you may be exposed to hepatitis B. Haemophilus influenzae type b (Hib) vaccine  You may need this if you have certain risk factors. Human papillomavirus (HPV) vaccine  If recommended by your health care provider, you may need three doses over 6 months. You may receive vaccines as individual doses or as more than one vaccine together in one shot (combination vaccines). Talk with your health care provider about the risks and benefits of combination vaccines. What tests do I need? Blood tests  Lipid and cholesterol levels. These may be checked every 5 years, or more frequently if you are over 60 years old.  Hepatitis C test.  Hepatitis B test. Screening  Lung cancer screening. You may have this screening every year starting at age 43 if you have a 30-pack-year history of smoking and currently smoke or have quit within the past 15 years.  Prostate cancer screening. Recommendations will vary depending on your family history and other risks.  Colorectal cancer screening. All adults should have this screening starting at age 72 and continuing until age 2. Your health care provider may recommend screening at age 14 if you are at increased risk. You will have tests every 1-10 years, depending on your results and the type of screening test.  Diabetes screening. This is done by checking your blood sugar (glucose) after you have not eaten  for a while (fasting). You may have this done every 1-3 years.  Sexually transmitted disease (STD) testing. Follow these instructions at home: Eating and drinking  Eat a diet that includes fresh fruits and vegetables, whole grains, lean protein, and low-fat dairy products.  Take vitamin and mineral supplements as  recommended by your health care provider.  Do not drink alcohol if your health care provider tells you not to drink.  If you drink alcohol: ? Limit how much you have to 0-2 drinks a day. ? Be aware of how much alcohol is in your drink. In the [place] one drink equals one 12 oz bottle of beer (355 mL), one 5 oz glass of wine (148 mL), or one 1 oz glass of hard liquor (44 mL). Lifestyle  Take daily care of your teeth and gums.  Stay active. Exercise for at least 30 minutes on 5 or more days each week.  Do not use any products that contain nicotine or tobacco, such as cigarettes, e-cigarettes, and chewing tobacco. If you need help quitting, ask your health care provider.  If you are sexually active, practice safe sex. Use a condom or other form of protection to prevent STIs (sexually transmitted infections).  Talk with your health care provider about taking a low-dose aspirin every day starting at age 53. What's next?  Go to your health care provider once a year for a well check visit.  Ask your health care provider how often you should have your eyes and teeth checked.  Stay up to date on all vaccines. This information is not intended to replace advice given to you by your health care provider. Make sure you discuss any questions you have with your health care provider. Document Revised: 12/08/2018 Document Reviewed: 12/08/2018 Elsevier Patient Education  2020 [employer].

## 2020-10-21 NOTE — Progress Notes (Signed)
Subjective:  Patient ID: Joshua Crawford, male    DOB: 02/19/1966  Age: 54 y.o. MRN: [id number]  Chief Complaint  Patient presents with   Annual Exam    pt is fasting, pt wants flu shot    HPI  Well Adult Physical: Patient here for a comprehensive physical exam.The patient reports problems - diabetes, hypercholesterolemia Do you take any herbs or supplements that were not prescribed by a doctor? no Are you taking calcium supplements? no Are you taking aspirin daily? no  Encounter for general adult medical examination without abnormal findings  Physical ("At Risk" items are starred): Patient's last physical exam was 1 year ago .  Smoking: Life-long non-smoker ;  Physical Activity: Exercises at least 3 times per week ;  Alcohol/Drug Use: Is a non-drinker ; No illicit drug use ;  Patient is not afflicted from Stress Incontinence and Urge Incontinence  Safety: reviewed ; Patient wears a seat belt, has smoke detectors, has carbon monoxide detectors, practices appropriate gun safety, and wears sunscreen with extended sun exposure. Dental Care: biannual cleanings, brushes and flosses daily. Ophthalmology/Optometry: Annual visit.  Hearing loss: none Vision impairments: none Last PSA: unknown    Office Visit from 10/21/2020 in [place]  PHQ-2 Total Score 0              Social History   Socioeconomic History   Marital status: Married    Spouse name: Not on file   Number of children: Not on file   Years of education: Not on file   Highest education level: Not on file  Occupational History   Occupation: unemployed  Tobacco Use   Smoking status: Former Smoker   Smokeless tobacco: Never Used  Scientific laboratory technician Use: Never used  Substance and Sexual Activity   Alcohol use: Yes    Comment: 0-3 daily   Drug use: Never   Sexual activity: Yes    Partners: Female  Other Topics Concern   Not on file  Social History Narrative   Not on file   Social  Determinants of Health   Financial Resource Strain:    Difficulty of Paying Living Expenses: Not on file  Food Insecurity:    Worried About Charity fundraiser in the Last Year: Not on file   [place] in the Last Year: Not on file  Transportation Needs:    Lack of Transportation (Medical): Not on file   Lack of Transportation (Non-Medical): Not on file  Physical Activity:    Days of Exercise per Week: Not on file   Minutes of Exercise per Session: Not on file  Stress:    Feeling of Stress : Not on file  Social Connections:    Frequency of Communication with Friends and Family: Not on file   Frequency of Social Gatherings with Friends and Family: Not on file   Attends Religious Services: Not on file   Active Member of Clubs or Organizations: Not on file   Attends Archivist Meetings: Not on file   Marital Status: Not on file   History reviewed. No pertinent past medical history. Past Surgical History:  Procedure Laterality Date   CATARACT EXTRACTION     COLONOSCOPY  12/04/2015   Colonic polyp status post polypectomy. Small internal hemorrhoids. Hypoerplastic polyp.    Left Hand/Wrist Fracture w/ surgical repair     Right Knee Arthroscopy     Right Rotator Cuff Repair      Family  History  Problem Relation Age of Onset   Ovarian cancer Mother    Lymphoma Sister    Liver cancer Paternal Uncle    Social History   Socioeconomic History   Marital status: Married    Spouse name: Not on file   Number of children: Not on file   Years of education: Not on file   Highest education level: Not on file  Occupational History   Occupation: unemployed  Tobacco Use   Smoking status: Former Smoker   Smokeless tobacco: Never Used  Scientific laboratory technician Use: Never used  Substance and Sexual Activity   Alcohol use: Yes    Comment: 0-3 daily   Drug use: Never   Sexual activity: Yes    Partners: Female  Other Topics Concern   Not on  file  Social History Narrative   Not on file   Social Determinants of Health   Financial Resource Strain:    Difficulty of Paying Living Expenses: Not on file  Food Insecurity:    Worried About Charity fundraiser in the Last Year: Not on file   [place] in the Last Year: Not on file  Transportation Needs:    Lack of Transportation (Medical): Not on file   Lack of Transportation (Non-Medical): Not on file  Physical Activity:    Days of Exercise per Week: Not on file   Minutes of Exercise per Session: Not on file  Stress:    Feeling of Stress : Not on file  Social Connections:    Frequency of Communication with Friends and Family: Not on file   Frequency of Social Gatherings with Friends and Family: Not on file   Attends Religious Services: Not on file   Active Member of Clubs or Organizations: Not on file   Attends Archivist Meetings: Not on file   Marital Status: Not on file   Review of Systems  Constitutional: Negative.   HENT: Negative.   Eyes: Negative.   Respiratory: Negative for cough, shortness of breath and stridor.   Cardiovascular: Negative for chest pain, palpitations and leg swelling.  Gastrointestinal: Negative.   Genitourinary: Negative.   Musculoskeletal: Positive for back pain.  Skin: Negative.   Neurological: Negative.   Psychiatric/Behavioral: Negative.      Objective:  BP 128/68    Pulse 90    Temp 97.6 F (36.4 C)    Resp 16    Ht 6\' 3"  (1.905 m)    Wt 283 lb 3.2 oz (128.5 kg)    SpO2 98%    BMI 35.40 kg/m   BP/Weight 10/21/2020 10/03/2020 79/02/9029  Systolic BP 092 330 076  Diastolic BP 68 78 78  Wt. (Lbs) 283.2 292 288  BMI 35.4 36.5 36    Physical Exam Vitals reviewed.  Constitutional:      Appearance: Normal appearance. He is obese.  HENT:     Head: Normocephalic and atraumatic.     Right Ear: Tympanic membrane, ear canal and external ear normal.     Left Ear: Tympanic membrane, ear canal and external  ear normal.     Nose: Nose normal.     Mouth/Throat:     Mouth: Mucous membranes are moist.     Pharynx: Oropharynx is clear.  Eyes:     Conjunctiva/sclera: Conjunctivae normal.     Pupils: Pupils are equal, round, and reactive to light.  Cardiovascular:     Rate and Rhythm: Normal rate and regular rhythm.  Pulses: Normal pulses.  Pulmonary:     Effort: Pulmonary effort is normal.     Breath sounds: Normal breath sounds.  Abdominal:     General: Abdomen is flat. Bowel sounds are normal.     Palpations: Abdomen is soft.  Genitourinary:    Prostate: Normal.  Musculoskeletal:        General: Normal range of motion.     Cervical back: Normal range of motion and neck supple.  Skin:    General: Skin is warm and dry.     Capillary Refill: Capillary refill takes less than 2 seconds.  Neurological:     General: No focal deficit present.     Mental Status: He is alert and oriented to person, place, and time.  Psychiatric:        Mood and Affect: Mood normal.        Behavior: Behavior normal.        Thought Content: Thought content normal.     Lab Results  Component Value Date   WBC 7.3 10/10/2020   HGB 15.5 10/10/2020   HCT 45.0 10/10/2020   PLT 172 10/10/2020   GLUCOSE 138 (H) 10/02/2020   CHOL 237 (H) 10/02/2020   TRIG 144 10/02/2020   HDL 51 10/02/2020   LDLCALC 160 (H) 10/02/2020   ALT 28 10/02/2020   AST 19 10/02/2020   NA 140 10/02/2020   K 4.3 10/02/2020   CL 100 10/02/2020   CREATININE 0.93 10/02/2020   BUN 13 10/02/2020   CO2 25 10/02/2020   TSH 2.000 10/02/2020   HGBA1C 6.5 (H) 10/03/2020      Assessment & Plan:  1. BMI 35.0-35.9,adult An individualize plan was formulated for obesity using patient history and physical exam to encourage weight loss.  An evidence based program was formulated.  Patient is to cut portion size with meals and to plan physical exercise 3 days a week at least 20 minutes.  Weight watchers and other programs are helpful.   Planned amount of weight loss 10 lbs.  2. Screening for colon cancer - Ambulatory referral to Gastroenterology Set up colonoscopy  3. Mixed hyperlipidemia - Comprehensive metabolic panel - Lipid panel AN INDIVIDUAL CARE PLAN for hyperlipidemia/ cholesterol was established and reinforced today.  The patient's status was assessed using clinical findings on exam, lab and other diagnostic tests. The patient's disease status was assessed based on evidence-based guidelines and found to be well controlled. MEDICATIONS were reviewed. SELF MANAGEMENT GOALS have been discussed and patient's success at attaining the goal of low cholesterol was assessed. RECOMMENDATION given include regular exercise 3 days a week and low cholesterol/low fat diet. CLINICAL SUMMARY including written plan to identify barriers unique to the patient due to social or economic  reasons was discussed.  4. Obesity, diabetes, and hypertension syndrome (HCC) - CBC with Differential/Platelet - Hemoglobin A1c An individual care plan for diabetes was established and reinforced today.  The patient's status was assessed using clinical findings on exam, labs and diagnostic testing. Patient success at meeting goals based on disease specific evidence-based guidelines and found to be good controlled. Medications were assessed and patient's understanding of the medical issues , including barriers were assessed. Recommend adherence to a diabetic diet, a graduated exercise program, HgbA1c level is checked quarterly, and urine microalbumin performed yearly .  Annual mono-filament sensation testing performed. Lower blood pressure and control hyperlipidemia is important. Get annual eye exams and annual flu shots and smoking cessation discussed.  Self management goals were  discussed.  5. Routine general medical examination at a health care facility  Routine health maintenance  information Body mass index is 35.4 kg/m.   These are the goals we  discussed: Goals   None      This is a list of the screening recommended for you and due dates:  Health Maintenance  Topic Date Due    Hepatitis C: One time screening is recommended by Center for Disease Control  (CDC) for  adults born from [birth year] through 1966.   Never done   Pneumococcal vaccine  Never done   Complete foot exam   Never done   Eye exam for diabetics  Never done   Urine Protein Check  Never done   COVID-19 Vaccine (1) Never done   HIV Screening  Never done   Tetanus Vaccine  Never done   Colon Cancer Screening  Never done   Hemoglobin A1C  04/03/2021   Flu Shot  Completed     AN INDIVIDUALIZED CARE PLAN: was established or reinforced today.   SELF MANAGEMENT: The patient and I together assessed ways to personally work towards obtaining the recommended goals  Support needs The patient and/or family needs were assessed and services were offered if appropriate.    Follow-up: Return in about 6 months (around 04/21/2021) for fasting.  An After Visit Summary was printed and given to the patient.  [place] [phone]

## 2020-10-22 ENCOUNTER — Encounter: Payer: 59 | Admitting: Physician Assistant

## 2020-10-22 ENCOUNTER — Other Ambulatory Visit: Payer: Self-pay

## 2020-10-22 LAB — COMPREHENSIVE METABOLIC PANEL
ALT: 36 IU/L (ref 0–44)
AST: 25 IU/L (ref 0–40)
Albumin/Globulin Ratio: 1.7 (ref 1.2–2.2)
Albumin: 4.5 g/dL (ref 3.8–4.9)
Alkaline Phosphatase: 65 IU/L (ref 44–121)
BUN/Creatinine Ratio: 13 (ref 9–20)
BUN: 11 mg/dL (ref 6–24)
Bilirubin Total: 0.7 mg/dL (ref 0.0–1.2)
CO2: 24 mmol/L (ref 20–29)
Calcium: 8.9 mg/dL (ref 8.7–10.2)
Chloride: 101 mmol/L (ref 96–106)
Creatinine, Ser: 0.87 mg/dL (ref 0.76–1.27)
GFR calc Af Amer: 112 mL/min/{1.73_m2} (ref >59)
GFR calc non Af Amer: 97 mL/min/{1.73_m2} (ref >59)
Globulin, Total: 2.6 g/dL (ref 1.5–4.5)
Glucose: 77 mg/dL (ref 65–99)
Potassium: 4 mmol/L (ref 3.5–5.2)
Sodium: 138 mmol/L (ref 134–144)
Total Protein: 7.1 g/dL (ref 6.0–8.5)

## 2020-10-22 LAB — CBC WITH DIFFERENTIAL/PLATELET
Basophils Absolute: 0 10*3/uL (ref 0.0–0.2)
Basos: 1 %
EOS (ABSOLUTE): 0.1 10*3/uL (ref 0.0–0.4)
Eos: 2 %
Hematocrit: 43.7 % (ref 37.5–51.0)
Hemoglobin: 15 g/dL (ref 13.0–17.7)
Immature Grans (Abs): 0 10*3/uL (ref 0.0–0.1)
Immature Granulocytes: 0 %
Lymphocytes Absolute: 1.8 10*3/uL (ref 0.7–3.1)
Lymphs: 28 %
MCH: 29.9 pg (ref 26.6–33.0)
MCHC: 34.3 g/dL (ref 31.5–35.7)
MCV: 87 fL (ref 79–97)
Monocytes Absolute: 0.5 10*3/uL (ref 0.1–0.9)
Monocytes: 7 %
Neutrophils Absolute: 4.1 10*3/uL (ref 1.4–7.0)
Neutrophils: 62 %
Platelets: 187 10*3/uL (ref 150–450)
RBC: 5.02 x10E6/uL (ref 4.14–5.80)
RDW: 12.9 % (ref 11.6–15.4)
WBC: 6.5 10*3/uL (ref 3.4–10.8)

## 2020-10-22 LAB — LIPID PANEL
Chol/HDL Ratio: 3.4 ratio (ref 0.0–5.0)
Cholesterol, Total: 150 mg/dL (ref 100–199)
HDL: 44 mg/dL (ref >39)
LDL Chol Calc (NIH): 89 mg/dL (ref 0–99)
Triglycerides: 93 mg/dL (ref 0–149)
VLDL Cholesterol Cal: 17 mg/dL (ref 5–40)

## 2020-10-22 LAB — CARDIOVASCULAR RISK ASSESSMENT

## 2020-10-22 LAB — HEMOGLOBIN A1C
Est. average glucose Bld gHb Est-mCnc: 128 mg/dL
Hgb A1c MFr Bld: 6.1 % — ABNORMAL HIGH (ref 4.8–5.6)

## 2020-10-22 MED ORDER — ATORVASTATIN CALCIUM 10 MG PO TABS
10.0000 mg | ORAL_TABLET | Freq: Every day | ORAL | 2 refills | Status: DC
Start: 2020-10-22 — End: 2021-08-22

## 2020-10-22 NOTE — Progress Notes (Signed)
CBC normal, kidney and liver tests normal, A1c 6.1 prediabetes range, Cholesterol normal lp

## 2020-11-15 ENCOUNTER — Other Ambulatory Visit: Payer: Self-pay | Admitting: Surgical

## 2020-12-03 ENCOUNTER — Encounter: Payer: 59 | Admitting: Physician Assistant

## 2020-12-16 ENCOUNTER — Encounter: Payer: Self-pay | Admitting: Physician Assistant

## 2021-01-28 ENCOUNTER — Ambulatory Visit (AMBULATORY_SURGERY_CENTER): Payer: Self-pay | Admitting: *Deleted

## 2021-01-28 ENCOUNTER — Other Ambulatory Visit: Payer: Self-pay

## 2021-01-28 VITALS — Ht 75.0 in | Wt 280.0 lb

## 2021-01-28 DIAGNOSIS — Z01818 Encounter for other preprocedural examination: Secondary | ICD-10-CM

## 2021-01-28 DIAGNOSIS — Z86010 Personal history of colonic polyps: Secondary | ICD-10-CM

## 2021-01-28 MED ORDER — CLENPIQ 10-3.5-12 MG-GM -GM/160ML PO SOLN: 1.0000 | ORAL | 0 refills | Status: DC

## 2021-01-28 NOTE — Progress Notes (Signed)
No egg or soy allergy known to patient  No issues with past sedation with any surgeries or procedures No intubation problems in the past  No FH of Malignant Hyperthermia No diet pills per patient No home 02 use per patient  No blood thinners per patient  Pt denies issues with constipation  No A fib or A flutter  EMMI video to pt or via MyChart  COVID 19 guidelines implemented in [facility] today with Pt and RN  Pt is not  fully vaccinated  for Covid - will test 2-10 at 330 pm   Clenpiq  Coupon given to pt in PV today , Code to Pharmacy and  NO PA's for preps discussed with pt  In [facility] today   Due to the COVID-19 pandemic we are asking patients to follow certain guidelines.  Pt aware of COVID protocols and [facility] guidelines

## 2021-02-06 ENCOUNTER — Other Ambulatory Visit: Payer: Self-pay | Admitting: Gastroenterology

## 2021-02-07 LAB — SARS CORONAVIRUS 2 (TAT 6-24 HRS): SARS Coronavirus 2: NEGATIVE

## 2021-02-10 ENCOUNTER — Encounter: Payer: Self-pay | Admitting: Gastroenterology

## 2021-02-11 ENCOUNTER — Other Ambulatory Visit: Payer: Self-pay

## 2021-02-11 ENCOUNTER — Other Ambulatory Visit: Payer: Self-pay | Admitting: Gastroenterology

## 2021-02-11 ENCOUNTER — Ambulatory Visit (AMBULATORY_SURGERY_CENTER): Payer: 59 | Admitting: Gastroenterology

## 2021-02-11 ENCOUNTER — Encounter: Payer: Self-pay | Admitting: Gastroenterology

## 2021-02-11 VITALS — BP 124/68 | HR 72 | Temp 97.7°F | Resp 11 | Ht 75.0 in | Wt 280.0 lb

## 2021-02-11 DIAGNOSIS — Z86010 Personal history of colonic polyps: Secondary | ICD-10-CM

## 2021-02-11 DIAGNOSIS — D124 Benign neoplasm of descending colon: Secondary | ICD-10-CM | POA: Diagnosis not present

## 2021-02-11 DIAGNOSIS — D128 Benign neoplasm of rectum: Secondary | ICD-10-CM | POA: Diagnosis not present

## 2021-02-11 MED ORDER — SODIUM CHLORIDE 0.9 % IV SOLN: 500.0000 mL | Freq: Once | INTRAVENOUS | Status: DC

## 2021-02-11 NOTE — Patient Instructions (Signed)
Handouts Provided:  Polyps  YOU HAD AN ENDOSCOPIC PROCEDURE TODAY AT THE [facility]:   Refer to the procedure report that was given to you for any specific questions about what was found during the examination.  If the procedure report does not answer your questions, please call your gastroenterologist to clarify.  If you requested that your care partner not be given the details of your procedure findings, then the procedure report has been included in a sealed envelope for you to review at your convenience later.  YOU SHOULD EXPECT: Some feelings of bloating in the abdomen. Passage of more gas than usual.  Walking can help get rid of the air that was put into your GI tract during the procedure and reduce the bloating. If you had a lower endoscopy (such as a colonoscopy or flexible sigmoidoscopy) you may notice spotting of blood in your stool or on the toilet paper. If you underwent a bowel prep for your procedure, you may not have a normal bowel movement for a few days.  Please Note:  You might notice some irritation and congestion in your nose or some drainage.  This is from the oxygen used during your procedure.  There is no need for concern and it should clear up in a day or so.  SYMPTOMS TO REPORT IMMEDIATELY:   Following lower endoscopy (colonoscopy or flexible sigmoidoscopy):  Excessive amounts of blood in the stool  Significant tenderness or worsening of abdominal pains  Swelling of the abdomen that is new, acute  Fever of 100F or higher  For urgent or emergent issues, a gastroenterologist can be reached at any hour by calling [phone]. Do not use MyChart messaging for urgent concerns.    DIET:  We do recommend a small meal at first, but then you may proceed to your regular diet.  Drink plenty of fluids but you should avoid alcoholic beverages for 24 hours.  ACTIVITY:  You should plan to take it easy for the rest of today and you should NOT DRIVE or use heavy  machinery until tomorrow (because of the sedation medicines used during the test).    FOLLOW UP: Our staff will call the number listed on your records 48-72 hours following your procedure to check on you and address any questions or concerns that you may have regarding the information given to you following your procedure. If we do not reach you, we will leave a message.  We will attempt to reach you two times.  During this call, we will ask if you have developed any symptoms of COVID 19. If you develop any symptoms (ie: fever, flu-like symptoms, shortness of breath, cough etc.) before then, please call [phone].  If you test positive for Covid 19 in the 2 weeks post procedure, please call and report this information to us.    If any biopsies were taken you will be contacted by phone or by letter within the next 1-3 weeks.  Please call us at [phone] if you have not heard about the biopsies in 3 weeks.    SIGNATURES/CONFIDENTIALITY: You and/or your care partner have signed paperwork which will be entered into your electronic medical record.  These signatures attest to the fact that that the information above on your After Visit Summary has been reviewed and is understood.  Full responsibility of the confidentiality of this discharge information lies with you and/or your care-partner.  

## 2021-02-11 NOTE — Progress Notes (Signed)
Pt's states no medical or surgical changes since previsit or office visit. 

## 2021-02-11 NOTE — Op Note (Signed)
Joshua Crawford Patient Name: Joshua Crawford Procedure Date: 02/11/2021 10:09 AM MRN: [id number] Endoscopist: Jackquline Denmark , MD Age: 55 Referring MD:  Date of Birth: June 19, 1966 Gender: Male Account #: [id number] Procedure:                Colonoscopy Indications:              High risk colon cancer surveillance: Personal                            history of colonic polyps Medicines:                Monitored Anesthesia Care Procedure:                Pre-Anesthesia Assessment:                           - Prior to the procedure, a History and Physical                            was performed, and patient medications and                            allergies were reviewed. The patient's tolerance of                            previous anesthesia was also reviewed. The risks                            and benefits of the procedure and the sedation                            options and risks were discussed with the patient.                            All questions were answered, and informed consent                            was obtained. Prior Anticoagulants: The patient has                            taken no previous anticoagulant or antiplatelet                            agents. ASA Grade Assessment: III - A patient with                            severe systemic disease. After reviewing the risks                            and benefits, the patient was deemed in                            satisfactory condition to undergo the procedure.  After obtaining informed consent, the colonoscope                            was passed under direct vision. Throughout the                            procedure, the patient's blood pressure, pulse, and                            oxygen saturations were monitored continuously. The                            Olympus CF-[id number]) Colonoscope was                            introduced through the anus and advanced to  the the                            cecum, identified by appendiceal orifice and                            ileocecal valve. The colonoscopy was performed                            without difficulty. The patient tolerated the                            procedure well. The quality of the bowel                            preparation was good. The ileocecal valve,                            appendiceal orifice, and rectum were photographed. Scope In: 10:17:43 AM Scope Out: 10:29:37 AM Scope Withdrawal Time: 0 hours 9 minutes 34 seconds  Total Procedure Duration: 0 hours 11 minutes 54 seconds  Findings:                 A 6 mm polyp was found in the proximal descending                            colon. The polyp was sessile. The polyp was removed                            with a cold snare. Resection and retrieval were                            complete.                           A 2 mm polyp was found in the rectum. The polyp was                            sessile. The polyp was removed with  a cold biopsy                            forceps. Resection and retrieval were complete.                           A few rare small-mouthed diverticula were found in                            the sigmoid colon.                           Non-bleeding internal hemorrhoids were found during                            retroflexion. The hemorrhoids were small.                           The exam was otherwise without abnormality on                            direct and retroflexion views. Complications:            No immediate complications. Estimated Blood Loss:     Estimated blood loss: none. Impression:               -Diminutive colonic polyps s/p polypectomy.                           -Minimal sigmoid diverticulosis.                           -Non-bleeding internal hemorrhoids.                           -The examination was otherwise normal on direct and                            retroflexion  views. Recommendation:           - Patient has a contact number available for                            emergencies. The signs and symptoms of potential                            delayed complications were discussed with the                            patient. Return to normal activities tomorrow.                            Written discharge instructions were provided to the                            patient.                           -  Resume previous diet.                           - Continue present medications.                           - Await pathology results.                           - Repeat colonoscopy for surveillance based on                            pathology results.                           - The findings and recommendations were discussed                            with the patient's Sister Joshua Crawford. Jackquline Denmark, MD 02/11/2021 10:34:53 AM This report has been signed electronically.

## 2021-02-11 NOTE — Progress Notes (Signed)
A and O x3. Report to RN. Tolerated MAC anesthesia well.

## 2021-02-11 NOTE — Progress Notes (Signed)
Called to room to assist during endoscopic procedure.  Patient ID and intended procedure confirmed with present staff. Received instructions for my participation in the procedure from the performing physician.  

## 2021-02-13 ENCOUNTER — Telehealth: Payer: Self-pay

## 2021-02-13 NOTE — Telephone Encounter (Signed)
  Follow up Call-  Call back number 02/11/2021 10/27/2018  Post procedure Call Back phone  # [phone]  Permission to leave phone message Yes Yes  Some recent data might be hidden     Patient questions:  Do you have a fever, pain , or abdominal swelling? No. Pain Score  0 *  Have you tolerated food without any problems? Yes.    Have you been able to return to your normal activities? Yes.    Do you have any questions about your discharge instructions: Diet   No. Medications  No. Follow up visit  No.  Do you have questions or concerns about your Care? No.  Actions: * If pain score is 4 or above: No action needed, pain <4. 1. Have you developed a fever since your procedure? no  2.   Have you had an respiratory symptoms (SOB or cough) since your procedure?  no  3.   Have you tested positive for COVID 19 since your procedure no  4.   Have you had any family members/close contacts diagnosed with the COVID 19 since your procedure?  no   If yes to any of these questions please route to Joylene John, RN and Joella Prince, RN

## 2021-02-17 ENCOUNTER — Encounter: Payer: Self-pay | Admitting: Gastroenterology

## 2021-04-24 ENCOUNTER — Encounter: Payer: Self-pay | Admitting: Physician Assistant

## 2021-04-24 ENCOUNTER — Other Ambulatory Visit: Payer: Self-pay

## 2021-04-24 ENCOUNTER — Ambulatory Visit: Payer: 59 | Admitting: Physician Assistant

## 2021-04-24 VITALS — BP 128/70 | HR 72 | Temp 96.1°F | Ht 73.0 in | Wt 282.6 lb

## 2021-04-24 DIAGNOSIS — R7301 Impaired fasting glucose: Secondary | ICD-10-CM

## 2021-04-24 DIAGNOSIS — Z125 Encounter for screening for malignant neoplasm of prostate: Secondary | ICD-10-CM

## 2021-04-24 DIAGNOSIS — E782 Mixed hyperlipidemia: Secondary | ICD-10-CM

## 2021-04-24 HISTORY — DX: Encounter for screening for malignant neoplasm of prostate: Z12.5

## 2021-04-24 NOTE — Progress Notes (Signed)
Subjective:  Patient ID: Joshua Crawford, male    DOB: April 16, 1966  Age: 55 y.o. MRN: [id number]  Chief Complaint  Patient presents with  . Hyperlipidemia    51M follow up    HPI Mixed hyperlipidemia  Pt presents with hyperlipidemia.Compliance with treatment has been good; The patient is compliant with medications, maintains a low cholesterol diet , follows up as directed , and maintains an exercise regimen . The patient denies experiencing any hypercholesterolemia related symptoms.  Pt currently on lipitor 10mg  qd  Pt has history of elevated glucose and last hgb a1c was consistenet with prediabetes at 6.1 - tries to watch diet - due for labwork  Pt due to check PSA  Current Outpatient Medications on File Prior to Visit  Medication Sig Dispense Refill  . atorvastatin (LIPITOR) 10 MG tablet Take 1 tablet (10 mg total) by mouth daily. 90 tablet 2  . cyclobenzaprine (FLEXERIL) 10 MG tablet Take 1 tablet (10 mg total) by mouth at bedtime. 30 tablet 0  . Multiple Vitamins-Minerals (MULTIVITAMIN MEN 50+) TABS Take by mouth.     No current facility-administered medications on file prior to visit.   Past Medical History:  Diagnosis Date  . Cataract    removed x 1 eye ? which   . Hyperlipidemia   . Sleep apnea    doesnt wear cpap now - 01-28-21   Past Surgical History:  Procedure Laterality Date  . CATARACT EXTRACTION    . COLONOSCOPY  12/04/2015   Colonic polyp status post polypectomy. Small internal hemorrhoids. Hypoerplastic polyp.   . Left Hand/Wrist Fracture w/ surgical repair    . POLYPECTOMY    . Right Knee Arthroscopy    . Right Rotator Cuff Repair      Family History  Problem Relation Age of Onset  . Ovarian cancer Mother   . Lymphoma Sister   . Liver cancer Paternal Uncle   . Colon polyps Neg Hx   . Colon cancer Neg Hx   . Esophageal cancer Neg Hx   . Rectal cancer Neg Hx   . Stomach cancer Neg Hx    Social History   Socioeconomic History  . Marital status:  Married    Spouse name: Not on file  . Number of children: Not on file  . Years of education: Not on file  . Highest education level: Not on file  Occupational History  . Occupation: unemployed  Tobacco Use  . Smoking status: Former Research scientist (life sciences)  . Smokeless tobacco: Never Used  Vaping Use  . Vaping Use: Never used  Substance and Sexual Activity  . Alcohol use: Yes    Comment: 0-3 daily  . Drug use: Never  . Sexual activity: Yes    Partners: Female  Other Topics Concern  . Not on file  Social History Narrative  . Not on file   Social Determinants of Health   Financial Resource Strain: Not on file  Food Insecurity: Not on file  Transportation Needs: Not on file  Physical Activity: Not on file  Stress: Not on file  Social Connections: Not on file    Review of Systems CONSTITUTIONAL: Negative for chills, fatigue, fever, unintentional weight gain and unintentional weight loss.  E/N/T: Negative for ear pain, nasal congestion and sore throat.  CARDIOVASCULAR: Negative for chest pain, dizziness, palpitations and pedal edema.  RESPIRATORY: Negative for recent cough and dyspnea.  GASTROINTESTINAL: Negative for abdominal pain, acid reflux symptoms, constipation, diarrhea, nausea and vomiting.  MSK: Negative for arthralgias  and myalgias.  INTEGUMENTARY: Negative for rash.  NEUROLOGICAL: Negative for dizziness and headaches.  PSYCHIATRIC: Negative for sleep disturbance and to question depression screen.  Negative for depression, negative for anhedonia.       Objective:  BP 128/70 (BP Location: Left Arm, Patient Position: Sitting, Cuff Size: Large)   Pulse 72   Temp (!) 96.1 F (35.6 C) (Temporal)   Ht 6\' 1"  (1.854 m)   Wt 282 lb 9.6 oz (128.2 kg)   SpO2 94%   BMI 37.28 kg/m   BP/Weight 04/24/2021 3/41/9379 0/01/4096  Systolic BP 353 299 -  Diastolic BP 70 68 -  Wt. (Lbs) 282.6 280 280  BMI 37.28 35 35    Physical Exam PHYSICAL EXAM:   VS: BP 128/70 (BP Location: Left  Arm, Patient Position: Sitting, Cuff Size: Large)   Pulse 72   Temp (!) 96.1 F (35.6 C) (Temporal)   Ht 6\' 1"  (1.854 m)   Wt 282 lb 9.6 oz (128.2 kg)   SpO2 94%   BMI 37.28 kg/m   GEN: Well nourished, well developed, in no acute distress  Cardiac: RRR; no murmurs, rubs, or gallops,no edema -  Respiratory:  normal respiratory rate and pattern with no distress - normal breath sounds with no rales, rhonchi, wheezes or rubs GI: normal bowel sounds, no masses or tenderness Skin: warm and dry, no rash  Psych: euthymic mood, appropriate affect and demeanor  Diabetic Foot Exam - Simple   Simple Foot Form Visual Inspection No deformities, no ulcerations, no other skin breakdown bilaterally: Yes Sensation Testing Intact to touch and monofilament testing bilaterally: Yes Pulse Check Posterior Tibialis and Dorsalis pulse intact bilaterally: Yes Comments      Lab Results  Component Value Date   WBC 6.5 10/21/2020   HGB 15.0 10/21/2020   HCT 43.7 10/21/2020   PLT 187 10/21/2020   GLUCOSE 77 10/21/2020   CHOL 150 10/21/2020   TRIG 93 10/21/2020   HDL 44 10/21/2020   LDLCALC 89 10/21/2020   ALT 36 10/21/2020   AST 25 10/21/2020   NA 138 10/21/2020   K 4.0 10/21/2020   CL 101 10/21/2020   CREATININE 0.87 10/21/2020   BUN 11 10/21/2020   CO2 24 10/21/2020   TSH 2.000 10/02/2020   HGBA1C 6.1 (H) 10/21/2020      Assessment & Plan:   1. Mixed hyperlipidemia - CBC with Differential/Platelet - Comprehensive metabolic panel - Lipid panel  2. Impaired fasting glucose - Hemoglobin A1c  3. Prostate cancer screening - PSA    No orders of the defined types were placed in this encounter.   Orders Placed This Encounter  Procedures  . CBC with Differential/Platelet  . Comprehensive metabolic panel  . Lipid panel  . PSA  . Hemoglobin A1c     Follow-up: Return in about 6 months (around 10/24/2021) for chronic fasting follow up.  An After Visit Summary was printed and  given to the patient.  Yetta Flock [facility] [phone]

## 2021-04-25 LAB — LIPID PANEL
Chol/HDL Ratio: 5 ratio (ref 0.0–5.0)
Cholesterol, Total: 191 mg/dL (ref 100–199)
HDL: 38 mg/dL — ABNORMAL LOW (ref >39)
LDL Chol Calc (NIH): 138 mg/dL — ABNORMAL HIGH (ref 0–99)
Triglycerides: 81 mg/dL (ref 0–149)
VLDL Cholesterol Cal: 15 mg/dL (ref 5–40)

## 2021-04-25 LAB — CBC WITH DIFFERENTIAL/PLATELET
Basophils Absolute: 0.1 10*3/uL (ref 0.0–0.2)
Basos: 1 %
EOS (ABSOLUTE): 0.1 10*3/uL (ref 0.0–0.4)
Eos: 2 %
Hematocrit: 45 % (ref 37.5–51.0)
Hemoglobin: 15.3 g/dL (ref 13.0–17.7)
Immature Grans (Abs): 0 10*3/uL (ref 0.0–0.1)
Immature Granulocytes: 0 %
Lymphocytes Absolute: 1.6 10*3/uL (ref 0.7–3.1)
Lymphs: 27 %
MCH: 29.5 pg (ref 26.6–33.0)
MCHC: 34 g/dL (ref 31.5–35.7)
MCV: 87 fL (ref 79–97)
Monocytes Absolute: 0.4 10*3/uL (ref 0.1–0.9)
Monocytes: 7 %
Neutrophils Absolute: 3.6 10*3/uL (ref 1.4–7.0)
Neutrophils: 63 %
Platelets: 178 10*3/uL (ref 150–450)
RBC: 5.18 x10E6/uL (ref 4.14–5.80)
RDW: 13 % (ref 11.6–15.4)
WBC: 5.9 10*3/uL (ref 3.4–10.8)

## 2021-04-25 LAB — COMPREHENSIVE METABOLIC PANEL
ALT: 26 IU/L (ref 0–44)
AST: 22 IU/L (ref 0–40)
Albumin/Globulin Ratio: 2 (ref 1.2–2.2)
Albumin: 4.5 g/dL (ref 3.8–4.9)
Alkaline Phosphatase: 72 IU/L (ref 44–121)
BUN/Creatinine Ratio: 18 (ref 9–20)
BUN: 19 mg/dL (ref 6–24)
Bilirubin Total: 0.5 mg/dL (ref 0.0–1.2)
CO2: 25 mmol/L (ref 20–29)
Calcium: 9.3 mg/dL (ref 8.7–10.2)
Chloride: 102 mmol/L (ref 96–106)
Creatinine, Ser: 1.07 mg/dL (ref 0.76–1.27)
Globulin, Total: 2.2 g/dL (ref 1.5–4.5)
Glucose: 106 mg/dL — ABNORMAL HIGH (ref 65–99)
Potassium: 4.5 mmol/L (ref 3.5–5.2)
Sodium: 139 mmol/L (ref 134–144)
Total Protein: 6.7 g/dL (ref 6.0–8.5)
eGFR: 82 mL/min/{1.73_m2} (ref >59)

## 2021-04-25 LAB — HEMOGLOBIN A1C
Est. average glucose Bld gHb Est-mCnc: 120 mg/dL
Hgb A1c MFr Bld: 5.8 % — ABNORMAL HIGH (ref 4.8–5.6)

## 2021-04-25 LAB — CARDIOVASCULAR RISK ASSESSMENT

## 2021-04-25 LAB — PSA: Prostate Specific Ag, Serum: 1.6 ng/mL (ref 0.0–4.0)

## 2021-08-22 ENCOUNTER — Other Ambulatory Visit: Payer: Self-pay | Admitting: Legal Medicine

## 2021-10-15 ENCOUNTER — Other Ambulatory Visit: Payer: Self-pay

## 2021-10-15 ENCOUNTER — Encounter: Payer: Self-pay | Admitting: Physician Assistant

## 2021-10-15 ENCOUNTER — Ambulatory Visit (INDEPENDENT_AMBULATORY_CARE_PROVIDER_SITE_OTHER): Payer: 59 | Admitting: Physician Assistant

## 2021-10-15 VITALS — BP 118/80 | HR 91 | Temp 97.4°F | Resp 16 | Ht 73.0 in | Wt 290.0 lb

## 2021-10-15 DIAGNOSIS — M79672 Pain in left foot: Secondary | ICD-10-CM | POA: Diagnosis not present

## 2021-10-15 DIAGNOSIS — M79671 Pain in right foot: Secondary | ICD-10-CM

## 2021-10-15 MED ORDER — PREDNISONE 20 MG PO TABS: ORAL_TABLET | ORAL | 0 refills | Status: AC

## 2021-10-15 NOTE — Progress Notes (Signed)
Acute Office Visit  Subjective:    Patient ID: Joshua Crawford, male    DOB: [birth year], 55 y.o.   MRN: [id number]  Chief Complaint  Patient presents with   Foot Pain    HPI: Patient is in today for bilateral foot pain - he states it has been going on for over a month - describes as a burning pain of his toes on left foot and a few toes on his right foot States only happens while wearing his work boots - when he takes them off he immediately has relief Definitely worse with walking on concrete while in boots also He is not having any pain today or burning at all He was seen at urgent care and treated for gout (although he has not had any red, hot swollen joints) with prednisone which helped possibly Of note pt is overdue for labwork /followup chronic medical problems  Past Medical History:  Diagnosis Date   Cataract    removed x 1 eye ? which    Hyperlipidemia    Sleep apnea    doesnt wear cpap now - 01-28-21    Past Surgical History:  Procedure Laterality Date   CATARACT EXTRACTION     COLONOSCOPY  12/04/2015   Colonic polyp status post polypectomy. Small internal hemorrhoids. Hypoerplastic polyp.    Left Hand/Wrist Fracture w/ surgical repair     POLYPECTOMY     Right Knee Arthroscopy     Right Rotator Cuff Repair      Family History  Problem Relation Age of Onset   Ovarian cancer Mother    Lymphoma Sister    Liver cancer Paternal Uncle    Colon polyps Neg Hx    Colon cancer Neg Hx    Esophageal cancer Neg Hx    Rectal cancer Neg Hx    Stomach cancer Neg Hx     Social History   Socioeconomic History   Marital status: Married    Spouse name: Not on file   Number of children: Not on file   Years of education: Not on file   Highest education level: Not on file  Occupational History   Occupation: unemployed  Tobacco Use   Smoking status: Former    Packs/day: 3.00    Years: 10.00    Pack years: 30.00    Types: Cigarettes    Quit date: 2004    Years since  quitting: 18.8   Smokeless tobacco: Never  Vaping Use   Vaping Use: Never used  Substance and Sexual Activity   Alcohol use: Yes    Alcohol/week: 2.0 standard drinks    Types: 2 Cans of beer per week    Comment: 0-3 daily   Drug use: Never   Sexual activity: Yes    Partners: Female  Other Topics Concern   Not on file  Social History Narrative   Not on file   Social Determinants of Health   Financial Resource Strain: Not on file  Food Insecurity: Not on file  Transportation Needs: Not on file  Physical Activity: Not on file  Stress: Not on file  Social Connections: Not on file  Intimate Partner Violence: Not on file    Outpatient Medications Prior to Visit  Medication Sig Dispense Refill   atorvastatin (LIPITOR) 10 MG tablet TAKE 1 TABLET BY MOUTH EVERY DAY 90 tablet 2   cyclobenzaprine (FLEXERIL) 10 MG tablet Take 1 tablet (10 mg total) by mouth at bedtime. 30 tablet 0   Multiple Vitamins-Minerals (  MULTIVITAMIN MEN 50+) TABS Take by mouth.     No facility-administered medications prior to visit.    Allergies  Allergen Reactions   Aleve [Naproxen Sodium] Anaphylaxis    Review of Systems CONSTITUTIONAL: Negative for chills, fatigue, fever, unintentional weight gain and unintentional weight loss.  CARDIOVASCULAR: Negative for chest pain, dizziness, palpitations and pedal edema.  RESPIRATORY: Negative for recent cough and dyspnea.  GASTROINTESTINAL: Negative for abdominal pain, acid reflux symptoms, constipation, diarrhea, nausea and vomiting.  MSK: see HPI INTEGUMENTARY: Negative for rash.          Objective:    Physical Exam PHYSICAL EXAM:   VS: BP 118/80   Pulse 91   Temp (!) 97.4 F (36.3 C)   Resp 16   Ht 6' 1" (1.854 m)   Wt 290 lb (131.5 kg)   SpO2 97%   BMI 38.26 kg/m   GEN: Well nourished, well developed, in no acute distress  Cardiac: RRR; no murmurs, rubs, or gallops,no edema - no significant varicosities Respiratory:  normal respiratory  rate and pattern with no distress - normal breath sounds with no rales, rhonchi, wheezes or rubs MS: no deformity or atrophy  Skin: warm and dry, no rash    BP 118/80   Pulse 91   Temp (!) 97.4 F (36.3 C)   Resp 16   Ht 6' 1" (1.854 m)   Wt 290 lb (131.5 kg)   SpO2 97%   BMI 38.26 kg/m  Wt Readings from Last 3 Encounters:  10/15/21 290 lb (131.5 kg)  04/24/21 282 lb 9.6 oz (128.2 kg)  02/11/21 280 lb (127 kg)    Health Maintenance Due  Topic Date Due   FOOT EXAM  Never done   OPHTHALMOLOGY EXAM  Never done   URINE MICROALBUMIN  Never done   Zoster Vaccines- Shingrix (1 of 2) Never done   INFLUENZA VACCINE  07/28/2021    There are no preventive care reminders to display for this patient.   Lab Results  Component Value Date   TSH 2.000 10/02/2020   Lab Results  Component Value Date   WBC 5.9 04/24/2021   HGB 15.3 04/24/2021   HCT 45.0 04/24/2021   MCV 87 04/24/2021   PLT 178 04/24/2021   Lab Results  Component Value Date   NA 139 04/24/2021   K 4.5 04/24/2021   CO2 25 04/24/2021   GLUCOSE 106 (H) 04/24/2021   BUN 19 04/24/2021   CREATININE 1.07 04/24/2021   BILITOT 0.5 04/24/2021   ALKPHOS 72 04/24/2021   AST 22 04/24/2021   ALT 26 04/24/2021   PROT 6.7 04/24/2021   ALBUMIN 4.5 04/24/2021   CALCIUM 9.3 04/24/2021   EGFR 82 04/24/2021   Lab Results  Component Value Date   CHOL 191 04/24/2021   Lab Results  Component Value Date   HDL 38 (L) 04/24/2021   Lab Results  Component Value Date   LDLCALC 138 (H) 04/24/2021   Lab Results  Component Value Date   TRIG 81 04/24/2021   Lab Results  Component Value Date   CHOLHDL 5.0 04/24/2021   Lab Results  Component Value Date   HGBA1C 5.8 (H) 04/24/2021       Assessment & Plan:   Problem List Items Addressed This Visit   None Visit Diagnoses     Foot pain, bilateral    -  Primary   Relevant Medications   predniSONE (DELTASONE) 20 MG tablet   Other Relevant Orders     Comprehensive  metabolic panel   Uric acid Recommend ice therapy Can try inserts in boots      Meds ordered this encounter  Medications   predniSONE (DELTASONE) 20 MG tablet    Sig: Take 3 tablets (60 mg total) by mouth daily with breakfast for 3 days, THEN 2 tablets (40 mg total) daily with breakfast for 3 days, THEN 1 tablet (20 mg total) daily with breakfast for 3 days.    Dispense:  18 tablet    Refill:  0    Order Specific Question:   Supervising Provider    AnswerShelton Silvas    Orders Placed This Encounter  Procedures   Comprehensive metabolic panel   Uric acid     Follow-up: Return in about 4 weeks (around 11/12/2021) for fasting physical.  An After Visit Summary was printed and given to the patient.  Yetta Flock [facility] [phone]

## 2021-10-16 LAB — COMPREHENSIVE METABOLIC PANEL
ALT: 28 IU/L (ref 0–44)
AST: 21 IU/L (ref 0–40)
Albumin/Globulin Ratio: 1.8 (ref 1.2–2.2)
Albumin: 4.4 g/dL (ref 3.8–4.9)
Alkaline Phosphatase: 64 IU/L (ref 44–121)
BUN/Creatinine Ratio: 17 (ref 9–20)
BUN: 16 mg/dL (ref 6–24)
Bilirubin Total: 0.4 mg/dL (ref 0.0–1.2)
CO2: 25 mmol/L (ref 20–29)
Calcium: 9.4 mg/dL (ref 8.7–10.2)
Chloride: 104 mmol/L (ref 96–106)
Creatinine, Ser: 0.92 mg/dL (ref 0.76–1.27)
Globulin, Total: 2.4 g/dL (ref 1.5–4.5)
Glucose: 97 mg/dL (ref 70–99)
Potassium: 4.4 mmol/L (ref 3.5–5.2)
Sodium: 145 mmol/L — ABNORMAL HIGH (ref 134–144)
Total Protein: 6.8 g/dL (ref 6.0–8.5)
eGFR: 98 mL/min/{1.73_m2} (ref >59)

## 2021-10-16 LAB — URIC ACID: Uric Acid: 5.7 mg/dL (ref 3.8–8.4)

## 2021-10-24 ENCOUNTER — Ambulatory Visit: Payer: 59 | Admitting: Physician Assistant

## 2021-11-06 ENCOUNTER — Ambulatory Visit (INDEPENDENT_AMBULATORY_CARE_PROVIDER_SITE_OTHER): Payer: 59 | Admitting: Physician Assistant

## 2021-11-06 ENCOUNTER — Encounter: Payer: Self-pay | Admitting: Physician Assistant

## 2021-11-06 ENCOUNTER — Other Ambulatory Visit: Payer: Self-pay

## 2021-11-06 VITALS — BP 110/78 | HR 92 | Temp 97.8°F | Resp 16 | Ht 73.0 in | Wt 287.0 lb

## 2021-11-06 DIAGNOSIS — R7301 Impaired fasting glucose: Secondary | ICD-10-CM | POA: Diagnosis not present

## 2021-11-06 DIAGNOSIS — Z23 Encounter for immunization: Secondary | ICD-10-CM | POA: Diagnosis not present

## 2021-11-06 DIAGNOSIS — E782 Mixed hyperlipidemia: Secondary | ICD-10-CM

## 2021-11-06 MED ORDER — CYCLOBENZAPRINE HCL 10 MG PO TABS: 10.0000 mg | ORAL_TABLET | Freq: Every day | ORAL | 2 refills | Status: DC

## 2021-11-06 NOTE — Progress Notes (Signed)
Subjective:  Patient ID: Joshua Crawford, male    DOB: 1966/05/24  Age: 55 y.o. MRN: [id number]  Chief Complaint  Patient presents with   Hyperlipidemia   Prediabetes    Hyperlipidemia  Mixed hyperlipidemia  Pt presents with hyperlipidemia.Compliance with treatment has been good; The patient is compliant with medications, maintains a low cholesterol diet , follows up as directed , and maintains an exercise regimen . The patient denies experiencing any hypercholesterolemia related symptoms.  Pt currently on lipitor 10mg  qd  Pt has history of elevated glucose and last hgb a1c was consistenet with prediabetes at 6.1 - tries to watch diet - due for labwork  Pt requests flu shot today  Pt still having some left foot discomfort - states 3rd and 4th toe hurt/tingle after putting on shoe - when take shoe off it resolves - recommend to see podiatry and pt states he will schedule Current Outpatient Medications on File Prior to Visit  Medication Sig Dispense Refill   atorvastatin (LIPITOR) 10 MG tablet TAKE 1 TABLET BY MOUTH EVERY DAY 90 tablet 2   Multiple Vitamins-Minerals (MULTIVITAMIN MEN 50+) TABS Take by mouth.     No current facility-administered medications on file prior to visit.   Past Medical History:  Diagnosis Date   Cataract    removed x 1 eye ? which    Hyperlipidemia    Sleep apnea    doesnt wear cpap now - 01-28-21   Past Surgical History:  Procedure Laterality Date   CATARACT EXTRACTION     COLONOSCOPY  12/04/2015   Colonic polyp status post polypectomy. Small internal hemorrhoids. Hypoerplastic polyp.    Left Hand/Wrist Fracture w/ surgical repair     POLYPECTOMY     Right Knee Arthroscopy     Right Rotator Cuff Repair      Family History  Problem Relation Age of Onset   Ovarian cancer Mother    Lymphoma Sister    Liver cancer Paternal Uncle    Colon polyps Neg Hx    Colon cancer Neg Hx    Esophageal cancer Neg Hx    Rectal cancer Neg Hx    Stomach cancer  Neg Hx    Social History   Socioeconomic History   Marital status: Married    Spouse name: Not on file   Number of children: Not on file   Years of education: Not on file   Highest education level: Not on file  Occupational History   Occupation: unemployed  Tobacco Use   Smoking status: Former    Packs/day: 3.00    Years: 10.00    Pack years: 30.00    Types: Cigarettes    Quit date: 2004    Years since quitting: 18.8   Smokeless tobacco: Never  Vaping Use   Vaping Use: Never used  Substance and Sexual Activity   Alcohol use: Yes    Alcohol/week: 2.0 standard drinks    Types: 2 Cans of beer per week    Comment: 0-3 daily   Drug use: Never   Sexual activity: Yes    Partners: Female  Other Topics Concern   Not on file  Social History Narrative   Not on file   Social Determinants of Health   Financial Resource Strain: Not on file  Food Insecurity: Not on file  Transportation Needs: Not on file  Physical Activity: Not on file  Stress: Not on file  Social Connections: Not on file    CONSTITUTIONAL: Negative for chills,  fatigue, fever, unintentional weight gain and unintentional weight loss.  E/N/T: Negative for ear pain, nasal congestion and sore throat.  CARDIOVASCULAR: Negative for chest pain, dizziness, palpitations and pedal edema.  RESPIRATORY: Negative for recent cough and dyspnea.  GASTROINTESTINAL: Negative for abdominal pain, acid reflux symptoms, constipation, diarrhea, nausea and vomiting.  MSK: see HPI INTEGUMENTARY: Negative for rash.    Objective:  PHYSICAL EXAM:   VS: BP 110/78   Pulse 92   Temp 97.8 F (36.6 C)   Resp 16   Ht 6\' 1"  (1.854 m)   Wt 287 lb (130.2 kg)   SpO2 95%   BMI 37.87 kg/m   GEN: Well nourished, well developed, in no acute distress  Cardiac: RRR; no murmurs, rubs, or gallops,no edema -  Respiratory:  normal respiratory rate and pattern with no distress - normal breath sounds with no rales, rhonchi, wheezes or  rubs MS: no deformity or atrophy  Skin: warm and dry, no rash  Psych: euthymic mood, appropriate affect and demeanor  Diabetic Foot Exam - Simple   No data filed      Lab Results  Component Value Date   WBC 5.9 04/24/2021   HGB 15.3 04/24/2021   HCT 45.0 04/24/2021   PLT 178 04/24/2021   GLUCOSE 97 10/15/2021   CHOL 191 04/24/2021   TRIG 81 04/24/2021   HDL 38 (L) 04/24/2021   LDLCALC 138 (H) 04/24/2021   ALT 28 10/15/2021   AST 21 10/15/2021   NA 145 (H) 10/15/2021   K 4.4 10/15/2021   CL 104 10/15/2021   CREATININE 0.92 10/15/2021   BUN 16 10/15/2021   CO2 25 10/15/2021   TSH 2.000 10/02/2020   HGBA1C 5.8 (H) 04/24/2021      Assessment & Plan:   1. Mixed hyperlipidemia - CBC with Differential/Platelet - Comprehensive metabolic panel - Lipid panel Continue meds as directed  2. Impaired fasting glucose - Comprehensive metabolic panel - Hemoglobin A1c Watch diet 3. Need for prophylactic vaccination and inoculation against influenza - Flu Vaccine MDCK QUAD PF    Meds ordered this encounter  Medications   cyclobenzaprine (FLEXERIL) 10 MG tablet    Sig: Take 1 tablet (10 mg total) by mouth at bedtime.    Dispense:  30 tablet    Refill:  2    Order Specific Question:   Supervising Provider    AnswerShelton Silvas     Orders Placed This Encounter  Procedures   Flu Vaccine MDCK QUAD PF   CBC with Differential/Platelet   Comprehensive metabolic panel   Lipid panel   Hemoglobin A1c     Follow-up: Return in about 6 months (around 05/06/2022) for fasting physical.  An After Visit Summary was printed and given to the patient.  Yetta Flock [facility] [phone]

## 2021-11-07 LAB — COMPREHENSIVE METABOLIC PANEL
ALT: 29 IU/L (ref 0–44)
AST: 21 IU/L (ref 0–40)
Albumin/Globulin Ratio: 2 (ref 1.2–2.2)
Albumin: 4.5 g/dL (ref 3.8–4.9)
Alkaline Phosphatase: 72 IU/L (ref 44–121)
BUN/Creatinine Ratio: 20 (ref 9–20)
BUN: 18 mg/dL (ref 6–24)
Bilirubin Total: 0.5 mg/dL (ref 0.0–1.2)
CO2: 24 mmol/L (ref 20–29)
Calcium: 9.3 mg/dL (ref 8.7–10.2)
Chloride: 104 mmol/L (ref 96–106)
Creatinine, Ser: 0.91 mg/dL (ref 0.76–1.27)
Globulin, Total: 2.3 g/dL (ref 1.5–4.5)
Glucose: 118 mg/dL — ABNORMAL HIGH (ref 70–99)
Potassium: 4.8 mmol/L (ref 3.5–5.2)
Sodium: 142 mmol/L (ref 134–144)
Total Protein: 6.8 g/dL (ref 6.0–8.5)
eGFR: 99 mL/min/{1.73_m2} (ref >59)

## 2021-11-07 LAB — CBC WITH DIFFERENTIAL/PLATELET
Basophils Absolute: 0 10*3/uL (ref 0.0–0.2)
Basos: 1 %
EOS (ABSOLUTE): 0.1 10*3/uL (ref 0.0–0.4)
Eos: 2 %
Hematocrit: 43.8 % (ref 37.5–51.0)
Hemoglobin: 15.3 g/dL (ref 13.0–17.7)
Immature Grans (Abs): 0 10*3/uL (ref 0.0–0.1)
Immature Granulocytes: 0 %
Lymphocytes Absolute: 2.1 10*3/uL (ref 0.7–3.1)
Lymphs: 30 %
MCH: 29.4 pg (ref 26.6–33.0)
MCHC: 34.9 g/dL (ref 31.5–35.7)
MCV: 84 fL (ref 79–97)
Monocytes Absolute: 0.5 10*3/uL (ref 0.1–0.9)
Monocytes: 7 %
Neutrophils Absolute: 4.2 10*3/uL (ref 1.4–7.0)
Neutrophils: 60 %
Platelets: 178 10*3/uL (ref 150–450)
RBC: 5.21 x10E6/uL (ref 4.14–5.80)
RDW: 12.9 % (ref 11.6–15.4)
WBC: 6.9 10*3/uL (ref 3.4–10.8)

## 2021-11-07 LAB — HEMOGLOBIN A1C
Est. average glucose Bld gHb Est-mCnc: 143 mg/dL
Hgb A1c MFr Bld: 6.6 % — ABNORMAL HIGH (ref 4.8–5.6)

## 2021-11-07 LAB — LIPID PANEL
Chol/HDL Ratio: 3.5 ratio (ref 0.0–5.0)
Cholesterol, Total: 157 mg/dL (ref 100–199)
HDL: 45 mg/dL (ref >39)
LDL Chol Calc (NIH): 92 mg/dL (ref 0–99)
Triglycerides: 111 mg/dL (ref 0–149)
VLDL Cholesterol Cal: 20 mg/dL (ref 5–40)

## 2021-11-07 LAB — CARDIOVASCULAR RISK ASSESSMENT

## 2022-05-08 ENCOUNTER — Encounter: Payer: 59 | Admitting: Physician Assistant

## 2022-06-04 ENCOUNTER — Encounter: Payer: Self-pay | Admitting: Physician Assistant

## 2022-06-04 ENCOUNTER — Ambulatory Visit (INDEPENDENT_AMBULATORY_CARE_PROVIDER_SITE_OTHER): Payer: 59 | Admitting: Physician Assistant

## 2022-06-04 VITALS — BP 124/68 | HR 81 | Temp 97.6°F | Resp 18 | Ht 73.0 in | Wt 285.0 lb

## 2022-06-04 DIAGNOSIS — R6882 Decreased libido: Secondary | ICD-10-CM

## 2022-06-04 DIAGNOSIS — R7301 Impaired fasting glucose: Secondary | ICD-10-CM | POA: Diagnosis not present

## 2022-06-04 DIAGNOSIS — R9431 Abnormal electrocardiogram [ECG] [EKG]: Secondary | ICD-10-CM

## 2022-06-04 DIAGNOSIS — E782 Mixed hyperlipidemia: Secondary | ICD-10-CM

## 2022-06-04 DIAGNOSIS — R5383 Other fatigue: Secondary | ICD-10-CM

## 2022-06-04 DIAGNOSIS — K625 Hemorrhage of anus and rectum: Secondary | ICD-10-CM

## 2022-06-04 HISTORY — DX: Decreased libido: R68.82

## 2022-06-04 HISTORY — DX: Other fatigue: R53.83

## 2022-06-05 LAB — LIPID PANEL
Chol/HDL Ratio: 4.8 ratio (ref 0.0–5.0)
Cholesterol, Total: 179 mg/dL (ref 100–199)
HDL: 37 mg/dL — ABNORMAL LOW (ref >39)
LDL Chol Calc (NIH): 118 mg/dL — ABNORMAL HIGH (ref 0–99)
Triglycerides: 134 mg/dL (ref 0–149)
VLDL Cholesterol Cal: 24 mg/dL (ref 5–40)

## 2022-06-05 LAB — CBC WITH DIFFERENTIAL/PLATELET
Basophils Absolute: 0 10*3/uL (ref 0.0–0.2)
Basos: 1 %
EOS (ABSOLUTE): 0.1 10*3/uL (ref 0.0–0.4)
Eos: 2 %
Hematocrit: 42.6 % (ref 37.5–51.0)
Hemoglobin: 14.7 g/dL (ref 13.0–17.7)
Immature Grans (Abs): 0 10*3/uL (ref 0.0–0.1)
Immature Granulocytes: 0 %
Lymphocytes Absolute: 1.8 10*3/uL (ref 0.7–3.1)
Lymphs: 28 %
MCH: 29.4 pg (ref 26.6–33.0)
MCHC: 34.5 g/dL (ref 31.5–35.7)
MCV: 85 fL (ref 79–97)
Monocytes Absolute: 0.5 10*3/uL (ref 0.1–0.9)
Monocytes: 8 %
Neutrophils Absolute: 4 10*3/uL (ref 1.4–7.0)
Neutrophils: 61 %
Platelets: 189 10*3/uL (ref 150–450)
RBC: 5 x10E6/uL (ref 4.14–5.80)
RDW: 13.4 % (ref 11.6–15.4)
WBC: 6.5 10*3/uL (ref 3.4–10.8)

## 2022-06-05 LAB — HEMOGLOBIN A1C
Est. average glucose Bld gHb Est-mCnc: 151 mg/dL
Hgb A1c MFr Bld: 6.9 % — ABNORMAL HIGH (ref 4.8–5.6)

## 2022-06-05 LAB — TESTOSTERONE,FREE AND TOTAL
Testosterone, Free: 5.8 pg/mL — ABNORMAL LOW (ref 7.2–24.0)
Testosterone: 196 ng/dL — ABNORMAL LOW (ref 264–916)

## 2022-06-05 LAB — COMPREHENSIVE METABOLIC PANEL
ALT: 34 IU/L (ref 0–44)
AST: 33 IU/L (ref 0–40)
Albumin/Globulin Ratio: 1.8 (ref 1.2–2.2)
Albumin: 4.5 g/dL (ref 3.8–4.9)
Alkaline Phosphatase: 76 IU/L (ref 44–121)
BUN/Creatinine Ratio: 15 (ref 9–20)
BUN: 15 mg/dL (ref 6–24)
Bilirubin Total: 0.7 mg/dL (ref 0.0–1.2)
CO2: 25 mmol/L (ref 20–29)
Calcium: 9.2 mg/dL (ref 8.7–10.2)
Chloride: 103 mmol/L (ref 96–106)
Creatinine, Ser: 1.01 mg/dL (ref 0.76–1.27)
Globulin, Total: 2.5 g/dL (ref 1.5–4.5)
Glucose: 86 mg/dL (ref 70–99)
Potassium: 4.4 mmol/L (ref 3.5–5.2)
Sodium: 143 mmol/L (ref 134–144)
Total Protein: 7 g/dL (ref 6.0–8.5)
eGFR: 87 mL/min/{1.73_m2} (ref >59)

## 2022-06-05 LAB — TSH: TSH: 2.66 u[IU]/mL (ref 0.450–4.500)

## 2022-06-05 LAB — CARDIOVASCULAR RISK ASSESSMENT

## 2022-06-08 ENCOUNTER — Encounter: Payer: Self-pay | Admitting: Physician Assistant

## 2022-06-08 NOTE — Progress Notes (Signed)
Subjective:  Patient ID: Joshua Crawford, male    DOB: 1966/01/07  Age: 56 y.o. MRN: [id number]  Chief Complaint  Patient presents with   Rectal bleeding Exertional fatigue    HPI  Pt states that over the past 2 years he has had several episodes of blood in his bms and notes leakage/staining of rectal bleeding.  Denies abdominal pain.  Last colonoscopy was about 4 years ago.  States symptoms are becoming more frequent  Pt states that he has had overall fatigue - worse with doing certain activities.  Seems to note he does not have stamina like he used to have  Pt with history of hyperlipidemia and is supposed to be taking lipitor however pt states he has stopped that medication because he would forget to take it No current outpatient medications on file prior to visit.   No current facility-administered medications on file prior to visit.   Past Medical History:  Diagnosis Date   Cataract    removed x 1 eye ? which    Hyperlipidemia    Sleep apnea    doesnt wear cpap now - 01-28-21   Past Surgical History:  Procedure Laterality Date   CATARACT EXTRACTION     COLONOSCOPY  12/04/2015   Colonic polyp status post polypectomy. Small internal hemorrhoids. Hypoerplastic polyp.    Left Hand/Wrist Fracture w/ surgical repair     POLYPECTOMY     Right Knee Arthroscopy     Right Rotator Cuff Repair      Family History  Problem Relation Age of Onset   Ovarian cancer Mother    Lymphoma Sister    Liver cancer Paternal Uncle    Colon polyps Neg Hx    Colon cancer Neg Hx    Esophageal cancer Neg Hx    Rectal cancer Neg Hx    Stomach cancer Neg Hx    Social History   Socioeconomic History   Marital status: Married    Spouse name: Not on file   Number of children: Not on file   Years of education: Not on file   Highest education level: Not on file  Occupational History   Occupation: unemployed  Tobacco Use   Smoking status: Former    Packs/day: 3.00    Years: 10.00    Total  pack years: 30.00    Types: Cigarettes    Quit date: 2004    Years since quitting: 19.4   Smokeless tobacco: Never  Vaping Use   Vaping Use: Never used  Substance and Sexual Activity   Alcohol use: Yes    Alcohol/week: 2.0 standard drinks of alcohol    Types: 2 Cans of beer per week    Comment: 0-3 daily   Drug use: Never   Sexual activity: Yes    Partners: Female  Other Topics Concern   Not on file  Social History Narrative   Not on file   Social Determinants of Health   Financial Resource Strain: Not on file  Food Insecurity: Not on file  Transportation Needs: Not on file  Physical Activity: Not on file  Stress: Not on file  Social Connections: Not on file    Review of Systems  CONSTITUTIONAL: see HPI E/N/T: Negative for ear pain, nasal congestion and sore throat.  CARDIOVASCULAR: Negative for chest pain, dizziness, palpitations and pedal edema.  RESPIRATORY: Negative for recent cough and dyspnea.  GASTROINTESTINAL: see HPI  MSK: Negative for arthralgias and myalgias.  INTEGUMENTARY: Negative for rash.  PSYCHIATRIC: Negative for sleep disturbance and to question depression screen.  Negative for depression, negative for anhedonia.      Objective:  PHYSICAL EXAM:   VS: BP 124/68   Pulse 81   Temp 97.6 F (36.4 C)   Resp 18   Ht '6\' 1"'$  (1.854 m)   Wt 285 lb (129.3 kg)   SpO2 98%   BMI 37.60 kg/m   GEN: Well nourished, well developed, in no acute distress   Cardiac: RRR; no murmurs, rubs, or gallops,no edema - Respiratory:  normal respiratory rate and pattern with no distress - normal breath sounds with no rales, rhonchi, wheezes or rubs GI: normal bowel sounds, no masses or tenderness  Skin: warm and dry, no rash   Psych: euthymic mood, appropriate affect and demeanor EKG - right bundle branch block with PACs  Lab Results  Component Value Date   WBC 6.5 06/04/2022   HGB 14.7 06/04/2022   HCT 42.6 06/04/2022   PLT 189 06/04/2022   GLUCOSE 86  06/04/2022   CHOL 179 06/04/2022   TRIG 134 06/04/2022   HDL 37 (L) 06/04/2022   LDLCALC 118 (H) 06/04/2022   ALT 34 06/04/2022   AST 33 06/04/2022   NA 143 06/04/2022   K 4.4 06/04/2022   CL 103 06/04/2022   CREATININE 1.01 06/04/2022   BUN 15 06/04/2022   CO2 25 06/04/2022   TSH 2.660 06/04/2022   HGBA1C 6.9 (H) 06/04/2022      Assessment & Plan:   Problem List Items Addressed This Visit       Endocrine   Impaired fasting glucose   Relevant Orders   Hemoglobin A1c (Completed)        Other   Mixed hyperlipidemia - Primary   Relevant Orders   Lipid panel (Completed)   Ambulatory referral to Cardiology   Other fatigue   Relevant Orders   CBC with Differential/Platelet (Completed)   Comprehensive metabolic panel (Completed)   TSH (Completed)   EKG 12-Lead   Ambulatory referral to Cardiology            Other Visit Diagnoses     Abnormal EKG       Relevant Orders   Ambulatory referral to Cardiology   Rectal bleeding       Relevant Orders   Ambulatory referral to Gastroenterology     .  No orders of the defined types were placed in this encounter.   Orders Placed This Encounter  Procedures   CBC with Differential/Platelet   Comprehensive metabolic panel   TSH   Lipid panel   Hemoglobin A1c   Testosterone,Free and Total   Cardiovascular Risk Assessment   Ambulatory referral to Cardiology   Ambulatory referral to Gastroenterology   EKG 12-Lead     Follow-up: Return in about 3 months (around 09/04/2022) for fasting physical - 43mn.  An After Visit Summary was printed and given to the patient.  [facility] [phone]

## 2022-06-10 ENCOUNTER — Other Ambulatory Visit: Payer: Self-pay | Admitting: Physician Assistant

## 2022-06-10 DIAGNOSIS — E1165 Type 2 diabetes mellitus with hyperglycemia: Secondary | ICD-10-CM

## 2022-06-18 DIAGNOSIS — G4730 Sleep apnea, unspecified: Secondary | ICD-10-CM | POA: Insufficient documentation

## 2022-06-18 DIAGNOSIS — E785 Hyperlipidemia, unspecified: Secondary | ICD-10-CM | POA: Insufficient documentation

## 2022-06-18 DIAGNOSIS — H269 Unspecified cataract: Secondary | ICD-10-CM | POA: Insufficient documentation

## 2022-06-19 ENCOUNTER — Telehealth: Payer: Self-pay | Admitting: Cardiology

## 2022-06-19 ENCOUNTER — Encounter: Payer: Self-pay | Admitting: Cardiology

## 2022-06-19 ENCOUNTER — Ambulatory Visit (INDEPENDENT_AMBULATORY_CARE_PROVIDER_SITE_OTHER): Payer: 59 | Admitting: Cardiology

## 2022-06-19 VITALS — BP 136/90 | HR 74 | Ht 75.0 in | Wt 288.6 lb

## 2022-06-19 DIAGNOSIS — E669 Obesity, unspecified: Secondary | ICD-10-CM

## 2022-06-19 DIAGNOSIS — R0609 Other forms of dyspnea: Secondary | ICD-10-CM

## 2022-06-19 DIAGNOSIS — R030 Elevated blood-pressure reading, without diagnosis of hypertension: Secondary | ICD-10-CM

## 2022-06-19 DIAGNOSIS — G4730 Sleep apnea, unspecified: Secondary | ICD-10-CM | POA: Diagnosis not present

## 2022-06-19 HISTORY — DX: Obesity, unspecified: E66.9

## 2022-06-19 HISTORY — DX: Elevated blood-pressure reading, without diagnosis of hypertension: R03.0

## 2022-06-19 HISTORY — DX: Other forms of dyspnea: R06.09

## 2022-06-25 ENCOUNTER — Encounter: Payer: Self-pay | Admitting: Cardiology

## 2022-06-25 DIAGNOSIS — R079 Chest pain, unspecified: Secondary | ICD-10-CM | POA: Diagnosis not present

## 2022-07-13 ENCOUNTER — Telehealth: Payer: Self-pay

## 2022-07-13 NOTE — Telephone Encounter (Signed)
Stress echo normal.  Left VM to callback.

## 2022-07-14 ENCOUNTER — Ambulatory Visit: Payer: 59

## 2022-07-14 NOTE — Telephone Encounter (Signed)
Letter mailed

## 2022-08-21 ENCOUNTER — Telehealth: Payer: Self-pay

## 2022-08-21 ENCOUNTER — Other Ambulatory Visit: Payer: Self-pay | Admitting: Family Medicine

## 2022-08-21 MED ORDER — LIDOCAINE 5 % EX PTCH
1.0000 | MEDICATED_PATCH | CUTANEOUS | 0 refills | Status: DC
Start: 2022-08-21 — End: 2024-03-01

## 2022-08-21 NOTE — Telephone Encounter (Signed)
Patient was seen last night in ER and they sent in lidocaine 5 percent patches, when he went to pick up rx they told him insurance will only pay for 30 and er doctor called in 10, patient wanted to know can provider sent in 30 so he is able to get it. Please advise

## 2022-08-21 NOTE — Telephone Encounter (Signed)
Patient informed, states they were rx'd for back pain. Call did get disconnected however patient did verbalized understanding.

## 2022-09-03 ENCOUNTER — Encounter: Payer: Self-pay | Admitting: Physician Assistant

## 2022-09-03 ENCOUNTER — Encounter: Payer: Self-pay | Admitting: Gastroenterology

## 2022-09-08 ENCOUNTER — Ambulatory Visit (INDEPENDENT_AMBULATORY_CARE_PROVIDER_SITE_OTHER): Payer: 59 | Admitting: Physician Assistant

## 2022-09-08 ENCOUNTER — Encounter: Payer: Self-pay | Admitting: Physician Assistant

## 2022-09-08 VITALS — BP 110/70 | HR 97 | Temp 97.8°F | Ht 72.5 in | Wt 280.0 lb

## 2022-09-08 DIAGNOSIS — Z0000 Encounter for general adult medical examination without abnormal findings: Secondary | ICD-10-CM

## 2022-09-08 DIAGNOSIS — M5440 Lumbago with sciatica, unspecified side: Secondary | ICD-10-CM | POA: Diagnosis not present

## 2022-09-08 DIAGNOSIS — E349 Endocrine disorder, unspecified: Secondary | ICD-10-CM

## 2022-09-08 HISTORY — DX: Endocrine disorder, unspecified: E34.9

## 2022-09-08 HISTORY — DX: Encounter for general adult medical examination without abnormal findings: Z00.00

## 2022-09-08 NOTE — Progress Notes (Signed)
Subjective:  Patient ID: Joshua Crawford, male    DOB: 1966/08/16  Age: 56 y.o. MRN: [id number]  Chief Complaint  Patient presents with   Annual Exam    HPI  Well Adult Physical: Patient here for a comprehensive physical exam.The patient reports  he was seen at ED a few week ago with low back pain and continues to have muscle spasms at times with certain movements on right side   ---- pt also with history of low testosterone and due to recheck labs - pt has not started supplements Do you take any herbs or supplements that were not prescribed by a doctor? no Are you taking calcium supplements? no Are you taking aspirin daily? no  Encounter for general adult medical examination without abnormal findings  Physical ("At Risk" items are starred): Patient's last physical exam was 1 year ago .  Patient wears a seat belt, has smoke detectors, has carbon monoxide detectors, , and wears sunscreen with extended sun exposure. Dental Care: is due for cleaning, brushes and flosses daily. Ophthalmology/Optometry: Annual visit.  Hearing loss: none Vision impairments: wears glasses Last PSA: is due  [employer] Visit from 09/08/2022 in [place]  PHQ-2 Total Score 0               Social History   Socioeconomic History   Marital status: Married    Spouse name: Not on file   Number of children: Not on file   Years of education: Not on file   Highest education level: Not on file  Occupational History   Occupation: unemployed  Tobacco Use   Smoking status: Former    Packs/day: 3.00    Years: 10.00    Total pack years: 30.00    Types: Cigarettes    Quit date: 2004    Years since quitting: 19.7   Smokeless tobacco: Never  Vaping Use   Vaping Use: Never used  Substance and Sexual Activity   Alcohol use: Yes    Alcohol/week: 2.0 standard drinks of alcohol    Types: 2 Cans of beer per week    Comment: 0-3 daily   Drug use: Never   Sexual activity: Yes    Partners:  Female  Other Topics Concern   Not on file  Social History Narrative   Not on file   Social Determinants of Health   Financial Resource Strain: Not on file  Food Insecurity: Not on file  Transportation Needs: Not on file  Physical Activity: Not on file  Stress: Not on file  Social Connections: Not on file   Past Medical History:  Diagnosis Date   Acute midline low back pain without sciatica 10/02/2020   BMI 35.0-35.9,adult 10/21/2020   Cataract    removed x 1 eye ? which    Decreased libido 06/04/2022   Hyperlipidemia    Impaired fasting glucose 10/08/2020   Leukocytosis 10/08/2020   Mixed hyperlipidemia 10/08/2020   Obesity, diabetes, and hypertension syndrome ([place]) 10/02/2020   Other fatigue 06/04/2022   Prostate cancer screening 04/24/2021   Routine general medical examination at a health care facility 10/21/2020   Sleep apnea    doesnt wear cpap now - 01-28-21   Past Surgical History:  Procedure Laterality Date   CATARACT EXTRACTION     COLONOSCOPY  12/04/2015   Colonic polyp status post polypectomy. Small internal hemorrhoids. Hypoerplastic polyp.    Left Hand/Wrist Fracture w/ surgical repair     POLYPECTOMY     Right  Knee Arthroscopy     Right Rotator Cuff Repair      Family History  Problem Relation Age of Onset   Ovarian cancer Mother    Lymphoma Sister    Liver cancer Paternal Uncle    Colon polyps Neg Hx    Colon cancer Neg Hx    Esophageal cancer Neg Hx    Rectal cancer Neg Hx    Stomach cancer Neg Hx    Social History   Socioeconomic History   Marital status: Married    Spouse name: Not on file   Number of children: Not on file   Years of education: Not on file   Highest education level: Not on file  Occupational History   Occupation: unemployed  Tobacco Use   Smoking status: Former    Packs/day: 3.00    Years: 10.00    Total pack years: 30.00    Types: Cigarettes    Quit date: 2004    Years since quitting: 19.7   Smokeless tobacco: Never   Vaping Use   Vaping Use: Never used  Substance and Sexual Activity   Alcohol use: Yes    Alcohol/week: 2.0 standard drinks of alcohol    Types: 2 Cans of beer per week    Comment: 0-3 daily   Drug use: Never   Sexual activity: Yes    Partners: Female  Other Topics Concern   Not on file  Social History Narrative   Not on file   Social Determinants of Health   Financial Resource Strain: Not on file  Food Insecurity: Not on file  Transportation Needs: Not on file  Physical Activity: Not on file  Stress: Not on file  Social Connections: Not on file   Review of Systems CONSTITUTIONAL: Negative for chills, fatigue, fever, unintentional weight gain and unintentional weight loss.  E/N/T: Negative for ear pain, nasal congestion and sore throat.  CARDIOVASCULAR: Negative for chest pain, dizziness, palpitations and pedal edema.  RESPIRATORY: Negative for recent cough and dyspnea.  GASTROINTESTINAL: Negative for abdominal pain, acid reflux symptoms, constipation, diarrhea, nausea and vomiting.  MSK: see HPI INTEGUMENTARY: Negative for rash.  NEUROLOGICAL: Negative for dizziness and headaches.  PSYCHIATRIC: Negative for sleep disturbance and to question depression screen.  Negative for depression, negative for anhedonia.       Objective:  BP 110/70 (BP Location: Left Arm, Patient Position: Sitting, Cuff Size: Large)   Pulse 97   Temp 97.8 F (36.6 C) (Temporal)   Ht 6' 0.5" (1.842 m)   Wt 280 lb (127 kg)   SpO2 98%   BMI 37.45 kg/m      09/08/2022   11:12 AM 06/19/2022    9:30 AM 06/04/2022    3:27 PM  BP/Weight  Systolic BP 629 528 413  Diastolic BP 70 90 68  Wt. (Lbs) 280 288.6 285  BMI 37.45 kg/m2 36.07 kg/m2 37.6 kg/m2    Physical Exam PHYSICAL EXAM:   VS: BP 110/70 (BP Location: Left Arm, Patient Position: Sitting, Cuff Size: Large)   Pulse 97   Temp 97.8 F (36.6 C) (Temporal)   Ht 6' 0.5" (1.842 m)   Wt 280 lb (127 kg)   SpO2 98%   BMI 37.45 kg/m    GEN: Well nourished, well developed, in no acute distress  HEENT: normal external ears and nose - normal external auditory canals and TMS - hearing grossly normal - Lips, Teeth and Gums - normal  Oropharynx - normal mucosa, palate, and posterior pharynx  Neck: no JVD or masses - no thyromegaly Cardiac: RRR; no murmurs, rubs, or gallops,no edema - Respiratory:  normal respiratory rate and pattern with no distress - normal breath sounds with no rales, rhonchi, wheezes or rubs GI: normal bowel sounds, no masses or tenderness MS: no deformity or atrophy - tender to right mid back to palpation Skin: warm and dry, no rash  Neuro:  Alert and Oriented x 3, Strength and sensation are intact - CN II-Xii grossly intact Psych: euthymic mood, appropriate affect and demeanor  Uanble to get ua  Lab Results  Component Value Date   WBC 6.5 06/04/2022   HGB 14.7 06/04/2022   HCT 42.6 06/04/2022   PLT 189 06/04/2022   GLUCOSE 86 06/04/2022   CHOL 179 06/04/2022   TRIG 134 06/04/2022   HDL 37 (L) 06/04/2022   LDLCALC 118 (H) 06/04/2022   ALT 34 06/04/2022   AST 33 06/04/2022   NA 143 06/04/2022   K 4.4 06/04/2022   CL 103 06/04/2022   CREATININE 1.01 06/04/2022   BUN 15 06/04/2022   CO2 25 06/04/2022   TSH 2.660 06/04/2022   HGBA1C 6.9 (H) 06/04/2022      Assessment & Plan:   Problem List Items Addressed This Visit       Other   Routine medical exam - Primary   Relevant Orders   CBC with Differential/Platelet   Comprehensive metabolic panel   TSH   Lipid panel   Hemoglobin A1c   PSA   Flu Vaccine MDCK QUAD PF (Completed)   Testosterone deficiency   Relevant Orders   Testosterone,Free and Total    Body mass index is 37.45 kg/m.   These are the goals we discussed:  Goals   None      This is a list of the screening recommended for you and due dates:  Health Maintenance  Topic Date Due   Complete foot exam   Never done   Eye exam for diabetics  Never done   Urine  Protein Check  Never done   Zoster (Shingles) Vaccine (1 of 2) 12/08/2022*   Tetanus Vaccine  09/09/2023*   Hemoglobin A1C  12/04/2022   Colon Cancer Screening  02/12/2028   Flu Shot  Completed   HPV Vaccine  Aged Out   COVID-19 Vaccine  Discontinued   Hepatitis C Screening: USPSTF Recommendation to screen - Ages 18-79 yo.  Discontinued   HIV Screening  Discontinued  *Topic was postponed. The date shown is not the original due date.     No orders of the defined types were placed in this encounter.    Follow-up: Return in about 6 months (around 03/09/2023) for chronic fasting follow up.  An After Visit Summary was printed and given to the patient.  Yetta Flock [facility] [phone]

## 2022-09-09 LAB — CBC WITH DIFFERENTIAL/PLATELET
Basophils Absolute: 0.1 10*3/uL (ref 0.0–0.2)
Basos: 1 %
EOS (ABSOLUTE): 0.2 10*3/uL (ref 0.0–0.4)
Eos: 2 %
Hematocrit: 47.7 % (ref 37.5–51.0)
Hemoglobin: 15.9 g/dL (ref 13.0–17.7)
Immature Grans (Abs): 0 10*3/uL (ref 0.0–0.1)
Immature Granulocytes: 0 %
Lymphocytes Absolute: 1.9 10*3/uL (ref 0.7–3.1)
Lymphs: 26 %
MCH: 29.2 pg (ref 26.6–33.0)
MCHC: 33.3 g/dL (ref 31.5–35.7)
MCV: 88 fL (ref 79–97)
Monocytes Absolute: 0.5 10*3/uL (ref 0.1–0.9)
Monocytes: 7 %
Neutrophils Absolute: 4.8 10*3/uL (ref 1.4–7.0)
Neutrophils: 64 %
Platelets: 194 10*3/uL (ref 150–450)
RBC: 5.44 x10E6/uL (ref 4.14–5.80)
RDW: 13 % (ref 11.6–15.4)
WBC: 7.5 10*3/uL (ref 3.4–10.8)

## 2022-09-09 LAB — LIPID PANEL
Chol/HDL Ratio: 4.8 ratio (ref 0.0–5.0)
Cholesterol, Total: 205 mg/dL — ABNORMAL HIGH (ref 100–199)
HDL: 43 mg/dL (ref >39)
LDL Chol Calc (NIH): 120 mg/dL — ABNORMAL HIGH (ref 0–99)
Triglycerides: 237 mg/dL — ABNORMAL HIGH (ref 0–149)
VLDL Cholesterol Cal: 42 mg/dL — ABNORMAL HIGH (ref 5–40)

## 2022-09-09 LAB — COMPREHENSIVE METABOLIC PANEL
ALT: 28 IU/L (ref 0–44)
AST: 21 IU/L (ref 0–40)
Albumin/Globulin Ratio: 1.8 (ref 1.2–2.2)
Albumin: 4.6 g/dL (ref 3.8–4.9)
Alkaline Phosphatase: 73 IU/L (ref 44–121)
BUN/Creatinine Ratio: 16 (ref 9–20)
BUN: 14 mg/dL (ref 6–24)
Bilirubin Total: 0.5 mg/dL (ref 0.0–1.2)
CO2: 24 mmol/L (ref 20–29)
Calcium: 9.3 mg/dL (ref 8.7–10.2)
Chloride: 102 mmol/L (ref 96–106)
Creatinine, Ser: 0.87 mg/dL (ref 0.76–1.27)
Globulin, Total: 2.5 g/dL (ref 1.5–4.5)
Glucose: 104 mg/dL — ABNORMAL HIGH (ref 70–99)
Potassium: 4.4 mmol/L (ref 3.5–5.2)
Sodium: 138 mmol/L (ref 134–144)
Total Protein: 7.1 g/dL (ref 6.0–8.5)
eGFR: 101 mL/min/{1.73_m2} (ref >59)

## 2022-09-09 LAB — TSH: TSH: 2.98 u[IU]/mL (ref 0.450–4.500)

## 2022-09-09 LAB — CARDIOVASCULAR RISK ASSESSMENT

## 2022-09-09 LAB — HEMOGLOBIN A1C
Est. average glucose Bld gHb Est-mCnc: 134 mg/dL
Hgb A1c MFr Bld: 6.3 % — ABNORMAL HIGH (ref 4.8–5.6)

## 2022-09-09 LAB — PSA: Prostate Specific Ag, Serum: 1.2 ng/mL (ref 0.0–4.0)

## 2022-09-10 DIAGNOSIS — M5440 Lumbago with sciatica, unspecified side: Secondary | ICD-10-CM

## 2022-09-10 HISTORY — DX: Lumbago with sciatica, unspecified side: M54.40

## 2022-09-10 MED ORDER — CYCLOBENZAPRINE HCL 5 MG PO TABS: 5.0000 mg | ORAL_TABLET | Freq: Every evening | ORAL | 0 refills | Status: DC | PRN

## 2022-09-10 NOTE — Addendum Note (Signed)
Addended byMarge Duncans on: 09/10/2022 09:10 AM   Modules accepted: Orders

## 2022-09-16 LAB — TESTOSTERONE,FREE AND TOTAL
Testosterone, Free: 8.7 pg/mL (ref 7.2–24.0)
Testosterone: 321 ng/dL (ref 264–916)

## 2022-11-11 ENCOUNTER — Encounter: Payer: Self-pay | Admitting: Gastroenterology

## 2022-11-11 ENCOUNTER — Ambulatory Visit (INDEPENDENT_AMBULATORY_CARE_PROVIDER_SITE_OTHER): Payer: 59 | Admitting: Gastroenterology

## 2022-11-11 ENCOUNTER — Other Ambulatory Visit: Payer: Self-pay | Admitting: Gastroenterology

## 2022-11-11 VITALS — BP 126/84 | HR 82 | Ht 73.0 in | Wt 279.5 lb

## 2022-11-11 DIAGNOSIS — K219 Gastro-esophageal reflux disease without esophagitis: Secondary | ICD-10-CM

## 2022-11-11 DIAGNOSIS — K625 Hemorrhage of anus and rectum: Secondary | ICD-10-CM | POA: Diagnosis not present

## 2022-11-11 DIAGNOSIS — R1319 Other dysphagia: Secondary | ICD-10-CM | POA: Diagnosis not present

## 2022-11-11 MED ORDER — HYDROCORTISONE (PERIANAL) 2.5 % EX CREA: 1.0000 | TOPICAL_CREAM | Freq: Two times a day (BID) | CUTANEOUS | 6 refills | Status: DC

## 2022-11-11 MED ORDER — PANTOPRAZOLE SODIUM 40 MG PO TBEC: 40.0000 mg | DELAYED_RELEASE_TABLET | Freq: Every day | ORAL | 3 refills | Status: DC

## 2022-11-11 NOTE — Progress Notes (Signed)
Chief Complaint: Esophageal dysphagia  Referring Provider:  Marge Duncans, PA-C      ASSESSMENT AND PLAN;   #1.  GERD   #2. Esophageal dysphagia.   #3. H/O colon polyps.  Rpt colon due 06/2028  #4. Rectal bleeding d/t hoids (resolved). Neg colon 01/2021  Plan: - Resume Protonix '40mg'$  po qd #90, 4RF - EGD with dil 11/20 &:30 - HC 2.5% cream BID x 10 days, 6RF - If still with hemorrhoidal problems, hemorrhoidal banding if needed - Rpt routine colon 06/2028   HPI:    Joshua Crawford is a 56 y.o. male  For follow-up visit. He stopped Protonix on his own. Started having recurrent solid food dysphagia mainly to meats over the last 1 yr. had done very well after dilation October 2019. Mainly in the upper chest With occasional heartburn and regurgitation. Heartburn intermittently has been for the last several years. No weight loss, hemoptysis No problems with liquids No nausea or vomiting  Had 1 episode of rectal bleeding which has resolved.  This was mostly bright red in color.  Attributed to hemorrhoids since had negative recent colonoscopy February 2022.  Diagnosed with sleep apnea  Recent negative stress test with normal EF.   Past GI work-up:  Colonoscopy 01/2021 -Diminutive colonic polyps s/p polypectomy. Bx-TA -Minimal sigmoid diverticulosis. -Non-bleeding internal hemorrhoids. -The examination was otherwise normal on direct and retroflexion views. - Rpt 7 yrs  EGD 09/2018 - Esophageal mucosal changes suggestive of eosinophilic esophagitis. Bx <10 eos/HPF c/w reflux. Dilated. - Mild gastritis. Bx- + HP.  Treated with triple drug therapy.  Past Surgical History:  Procedure Laterality Date   CATARACT EXTRACTION     COLONOSCOPY  12/04/2015   Colonic polyp status post polypectomy. Small internal hemorrhoids. Hypoerplastic polyp.    Left Hand/Wrist Fracture w/ surgical repair     POLYPECTOMY     Right Knee Arthroscopy     Right Rotator Cuff Repair      Family  History  Problem Relation Age of Onset   Ovarian cancer Mother    Lymphoma Sister    Liver cancer Paternal Uncle    Colon polyps Neg Hx    Colon cancer Neg Hx    Esophageal cancer Neg Hx    Rectal cancer Neg Hx    Stomach cancer Neg Hx     Social History   Tobacco Use   Smoking status: Former    Packs/day: 3.00    Years: 10.00    Total pack years: 30.00    Types: Cigarettes    Quit date: 2004    Years since quitting: 19.8   Smokeless tobacco: Never  Vaping Use   Vaping Use: Never used  Substance Use Topics   Alcohol use: Yes    Alcohol/week: 2.0 standard drinks of alcohol    Types: 2 Cans of beer per week    Comment: once a week/occ   Drug use: Never    Current Outpatient Medications  Medication Sig Dispense Refill   cyclobenzaprine (FLEXERIL) 5 MG tablet Take 1 tablet (5 mg total) by mouth at bedtime as needed for muscle spasms. 30 tablet 0   ibuprofen (ADVIL) 600 MG tablet Take 600 mg by mouth every 6 (six) hours as needed.     lidocaine (LIDODERM) 5 % Place 1 patch onto the skin daily. Remove & Discard patch within 12 hours or as directed by MD 30 patch 0   No current facility-administered medications for this visit.    Allergies  Allergen Reactions   Aleve [Naproxen Sodium] Anaphylaxis    Review of Systems:  Constitutional: Denies fever, chills, diaphoresis, appetite change and fatigue.  HEENT: Denies photophobia, eye pain, redness, hearing loss, ear pain, congestion, sore throat, rhinorrhea, sneezing, mouth sores, neck pain, neck stiffness and tinnitus.   Respiratory: Denies SOB, DOE, cough, chest tightness,  and wheezing.   Cardiovascular: Denies chest pain, palpitations and leg swelling.  Genitourinary: Denies dysuria, urgency, frequency, hematuria, flank pain and difficulty urinating.  Musculoskeletal: Denies myalgias, has back pain, no joint swelling, arthralgias and gait problem.  Skin: No rash.  Neurological: Denies dizziness, seizures, syncope,  weakness, light-headedness, numbness and headaches.  Hematological: Denies adenopathy. Easy bruising, personal or family bleeding history  Psychiatric/Behavioral: No anxiety or depression     Physical Exam:    BP 126/84   Pulse 82   Ht '6\' 1"'$  (1.854 m)   Wt 279 lb 8 oz (126.8 kg)   SpO2 96%   BMI 36.88 kg/m  Filed Weights   11/11/22 0917  Weight: 279 lb 8 oz (126.8 kg)   Constitutional:  Well-developed, in no acute distress. Psychiatric: Normal mood and affect. Behavior is normal. HEENT: Pupils normal.  Conjunctivae are normal. No scleral icterus. Neck supple.  Cardiovascular: Normal rate, regular rhythm. No edema Pulmonary/chest: Effort normal and breath sounds normal. No wheezing, rales or rhonchi. Abdominal: Soft, nondistended. Nontender. Bowel sounds active throughout. There are no masses palpable. No hepatomegaly. Rectal:  defered Neurological: Alert and oriented to person place and time. Skin: Skin is warm and dry. No rashes noted.    Carmell Austria, MD 11/11/2022, 9:31 AM  Cc: Marge Duncans, PA-C

## 2022-11-11 NOTE — Patient Instructions (Addendum)
_______________________________________________________  If you are age 57 or older, your body mass index should be between 23-30. Your Body mass index is 36.88 kg/m. If this is out of the aforementioned range listed, please consider follow up with your Primary Care Provider.  If you are age 1 or younger, your body mass index should be between 19-25. Your Body mass index is 36.88 kg/m. If this is out of the aformentioned range listed, please consider follow up with your Primary Care Provider.   ________________________________________________________  The [facility] providers would like to encourage you to use [facility] to communicate with providers for non-urgent requests or questions.  Due to long hold times on the telephone, sending your provider a message by [facility] may be a faster and more efficient way to get a response.  Please allow 48 business hours for a response.  Please remember that this is for non-urgent requests.  _______________________________________________________  We have sent the following medications to your pharmacy for you to pick up at your convenience: Protonix '40mg'$  daily HC 2.5% cream  You have been scheduled for an endoscopy. Please follow written instructions given to you at your visit today. If you use inhalers (even only as needed), please bring them with you on the day of your procedure.  Repeat colon 06-2028 a letter will be sent as it gets closer  Thank you,  Dr. Jackquline Denmark

## 2022-11-15 ENCOUNTER — Encounter: Payer: Self-pay | Admitting: Certified Registered Nurse Anesthetist

## 2022-11-16 ENCOUNTER — Ambulatory Visit (AMBULATORY_SURGERY_CENTER): Payer: 59 | Admitting: Gastroenterology

## 2022-11-16 ENCOUNTER — Encounter: Payer: Self-pay | Admitting: Gastroenterology

## 2022-11-16 VITALS — BP 118/84 | HR 70 | Temp 98.2°F | Resp 12 | Ht 73.0 in | Wt 279.0 lb

## 2022-11-16 DIAGNOSIS — K317 Polyp of stomach and duodenum: Secondary | ICD-10-CM | POA: Diagnosis not present

## 2022-11-16 DIAGNOSIS — K219 Gastro-esophageal reflux disease without esophagitis: Secondary | ICD-10-CM

## 2022-11-16 DIAGNOSIS — K2950 Unspecified chronic gastritis without bleeding: Secondary | ICD-10-CM | POA: Diagnosis not present

## 2022-11-16 DIAGNOSIS — R1310 Dysphagia, unspecified: Secondary | ICD-10-CM | POA: Diagnosis not present

## 2022-11-16 DIAGNOSIS — K319 Disease of stomach and duodenum, unspecified: Secondary | ICD-10-CM

## 2022-11-16 MED ORDER — PANTOPRAZOLE SODIUM 40 MG PO TBEC: 40.0000 mg | DELAYED_RELEASE_TABLET | Freq: Every day | ORAL | 3 refills | Status: DC

## 2022-11-16 MED ORDER — SODIUM CHLORIDE 0.9 % IV SOLN: 500.0000 mL | Freq: Once | INTRAVENOUS | Status: DC

## 2022-11-16 NOTE — Op Note (Signed)
[place] Patient Name: Joshua Crawford Procedure Date: 11/16/2022 7:27 AM MRN: [id number] Endoscopist: Jackquline Denmark , MD, [id number] Age: 56 Referring MD:  Date of Birth: October 19, 1966 Gender: Male Account #: [id number] Procedure:                Upper GI endoscopy Indications:              Dysphagia Medicines:                Monitored Anesthesia Care Procedure:                Pre-Anesthesia Assessment:                           - Prior to the procedure, a History and Physical                            was performed, and patient medications and                            allergies were reviewed. The patient's tolerance of                            previous anesthesia was also reviewed. The risks                            and benefits of the procedure and the sedation                            options and risks were discussed with the patient.                            All questions were answered, and informed consent                            was obtained. Prior Anticoagulants: The patient has                            taken no anticoagulant or antiplatelet agents. ASA                            Grade Assessment: II - A patient with mild systemic                            disease. After reviewing the risks and benefits,                            the patient was deemed in satisfactory condition to                            undergo the procedure.                           After obtaining informed consent, the endoscope was  passed under direct vision. Throughout the                            procedure, the patient's blood pressure, pulse, and                            oxygen saturations were monitored continuously. The                            GIF [id number] #[id number] was introduced through the                            mouth, and advanced to the second part of duodenum.                            The upper GI endoscopy was accomplished without                             difficulty. The patient tolerated the procedure                            well. Scope In: Scope Out: Findings:                 LA Grade C (one or more mucosal breaks continuous                            between tops of 2 or more mucosal folds, less than                            75% circumference) esophagitis with no bleeding was                            found 38 cm from the incisors. Biopsies were                            obtained from the proximal and distal esophagus                            with cold forceps for histology of suspected                            eosinophilic esophagitis. The scope was withdrawn.                            Dilation was performed with a Maloney dilator with                            mild resistance at 3 Fr and 54 Fr.                           The entire examined stomach was normal. Biopsies  were taken with a cold forceps for histology.                           The examined duodenum was normal. Complications:            No immediate complications. Estimated Blood Loss:     Estimated blood loss: none. Estimated blood loss:                            none. Impression:               - LA Grade C reflux esophagitis.                           - S/P esophageal dilatation. Recommendation:           - Patient has a contact number available for                            emergencies. The signs and symptoms of potential                            delayed complications were discussed with the                            patient. Return to normal activities tomorrow.                            Written discharge instructions were provided to the                            patient.                           - Post dilatation diet.                           - Continue present medications. Please resume                            Protonix 40 mg p.o. daily.                           - Await pathology  results.                           - Avoid ibuprofen, naproxen, or other non-steroidal                            anti-inflammatory drugs.                           - The findings and recommendations were discussed                            with the patient's family. Jackquline Denmark, MD 11/16/2022 8:02:42 AM This report has been signed electronically.

## 2022-11-16 NOTE — Patient Instructions (Signed)
Take Protonix 40 mg daily  NO ASPIRIN, ASPIRIN CONTAINING PRODUCTS (BC OR GOODY POWDERS) OR NSAIDS (IBUPROFEN, ADVIL, ALEVE, AND MOTRIN); TYLENOL IS OK TO TAKE Await pathology results   YOU HAD AN ENDOSCOPIC PROCEDURE TODAY AT [facility]:   Refer to the procedure report that was given to you for any specific questions about what was found during the examination.  If the procedure report does not answer your questions, please call your gastroenterologist to clarify.  If you requested that your care partner not be given the details of your procedure findings, then the procedure report has been included in a sealed envelope for you to review at your convenience later.  YOU SHOULD EXPECT: Some feelings of bloating in the abdomen. Passage of more gas than usual.  Walking can help get rid of the air that was put into your GI tract during the procedure and reduce the bloating. If you had a lower endoscopy (such as a colonoscopy or flexible sigmoidoscopy) you may notice spotting of blood in your stool or on the toilet paper. If you underwent a bowel prep for your procedure, you may not have a normal bowel movement for a few days.  Please Note:  You might notice some irritation and congestion in your nose or some drainage.  This is from the oxygen used during your procedure.  There is no need for concern and it should clear up in a day or so.  SYMPTOMS TO REPORT IMMEDIATELY:  Following upper endoscopy (EGD)  Vomiting of blood or coffee ground material  New chest pain or pain under the shoulder blades  Painful or persistently difficult swallowing  New shortness of breath  Fever of 100F or higher  Black, tarry-looking stools  For urgent or emergent issues, a gastroenterologist can be reached at any hour by calling [phone]. Do not use MyChart messaging for urgent concerns.    DIET:  Dilation diet today.  Drink plenty of fluids but you should avoid alcoholic beverages for 24  hours.  ACTIVITY:  You should plan to take it easy for the rest of today and you should NOT DRIVE or use heavy machinery until tomorrow (because of the sedation medicines used during the test).    FOLLOW UP: Our staff will call the number listed on your records the next business day following your procedure.  We will call around 7:15- 8:00 am to check on you and address any questions or concerns that you may have regarding the information given to you following your procedure. If we do not reach you, we will leave a message.     If any biopsies were taken you will be contacted by phone or by letter within the next 1-3 weeks.  Please call us at [phone] if you have not heard about the biopsies in 3 weeks.    SIGNATURES/CONFIDENTIALITY: You and/or your care partner have signed paperwork which will be entered into your electronic medical record.  These signatures attest to the fact that that the information above on your After Visit Summary has been reviewed and is understood.  Full responsibility of the confidentiality of this discharge information lies with you and/or your care-partner.

## 2022-11-16 NOTE — Progress Notes (Signed)
Chief Complaint: Esophageal dysphagia  Referring Provider:  Marge Duncans, PA-C      ASSESSMENT AND PLAN;   #1.  GERD   #2. Esophageal dysphagia.   #3. H/O colon polyps.  Rpt colon due 06/2028  #4. Rectal bleeding d/t hoids (resolved). Neg colon 01/2021  Plan: - Resume Protonix '40mg'$  po qd #90, 4RF - EGD with dil 11/20 &:30 - HC 2.5% cream BID x 10 days, 6RF - If still with hemorrhoidal problems, hemorrhoidal banding if needed - Rpt routine colon 06/2028   HPI:    Joshua Crawford is a 56 y.o. male  For follow-up visit. He stopped Protonix on his own. Started having recurrent solid food dysphagia mainly to meats over the last 1 yr. had done very well after dilation October 2019. Mainly in the upper chest With occasional heartburn and regurgitation. Heartburn intermittently has been for the last several years. No weight loss, hemoptysis No problems with liquids No nausea or vomiting  Had 1 episode of rectal bleeding which has resolved.  This was mostly bright red in color.  Attributed to hemorrhoids since had negative recent colonoscopy February 2022.  Diagnosed with sleep apnea  Recent negative stress test with normal EF.   Past GI work-up:  Colonoscopy 01/2021 -Diminutive colonic polyps s/p polypectomy. Bx-TA -Minimal sigmoid diverticulosis. -Non-bleeding internal hemorrhoids. -The examination was otherwise normal on direct and retroflexion views. - Rpt 7 yrs  EGD 09/2018 - Esophageal mucosal changes suggestive of eosinophilic esophagitis. Bx <10 eos/HPF c/w reflux. Dilated. - Mild gastritis. Bx- + HP.  Treated with triple drug therapy.  Past Surgical History:  Procedure Laterality Date   CATARACT EXTRACTION     COLONOSCOPY  12/04/2015   Colonic polyp status post polypectomy. Small internal hemorrhoids. Hypoerplastic polyp.    Left Hand/Wrist Fracture w/ surgical repair     POLYPECTOMY     Right Knee Arthroscopy     Right Rotator Cuff Repair     UPPER  GASTROINTESTINAL ENDOSCOPY      Family History  Problem Relation Age of Onset   Ovarian cancer Mother    Lymphoma Sister    Liver cancer Paternal Uncle    Colon polyps Neg Hx    Colon cancer Neg Hx    Esophageal cancer Neg Hx    Rectal cancer Neg Hx    Stomach cancer Neg Hx     Social History   Tobacco Use   Smoking status: Former    Packs/day: 3.00    Years: 10.00    Total pack years: 30.00    Types: Cigarettes    Quit date: 2004    Years since quitting: 19.8   Smokeless tobacco: Never  Vaping Use   Vaping Use: Never used  Substance Use Topics   Alcohol use: Yes    Alcohol/week: 2.0 standard drinks of alcohol    Types: 2 Cans of beer per week    Comment: once a week/occ   Drug use: Never    Current Outpatient Medications  Medication Sig Dispense Refill   cyclobenzaprine (FLEXERIL) 5 MG tablet Take 1 tablet (5 mg total) by mouth at bedtime as needed for muscle spasms. 30 tablet 0   hydrocortisone (ANUSOL-HC) 2.5 % rectal cream Place 1 Application rectally 2 (two) times daily. For 10 days to the rectum area and then use as needed 30 g 6   ibuprofen (ADVIL) 600 MG tablet Take 600 mg by mouth every 6 (six) hours as needed.     lidocaine (  LIDODERM) 5 % Place 1 patch onto the skin daily. Remove & Discard patch within 12 hours or as directed by MD 30 patch 0   pantoprazole (PROTONIX) 40 MG tablet Take 1 tablet (40 mg total) by mouth daily. 90 tablet 3   Current Facility-Administered Medications  Medication Dose Route Frequency Provider Last Rate Last Admin   0.9 %  sodium chloride infusion  500 mL Intravenous Once Jackquline Denmark, MD        Allergies  Allergen Reactions   Aleve [Naproxen Sodium] Anaphylaxis    Review of Systems:  Constitutional: Denies fever, chills, diaphoresis, appetite change and fatigue.  HEENT: Denies photophobia, eye pain, redness, hearing loss, ear pain, congestion, sore throat, rhinorrhea, sneezing, mouth sores, neck pain, neck stiffness and  tinnitus.   Respiratory: Denies SOB, DOE, cough, chest tightness,  and wheezing.   Cardiovascular: Denies chest pain, palpitations and leg swelling.  Genitourinary: Denies dysuria, urgency, frequency, hematuria, flank pain and difficulty urinating.  Musculoskeletal: Denies myalgias, has back pain, no joint swelling, arthralgias and gait problem.  Skin: No rash.  Neurological: Denies dizziness, seizures, syncope, weakness, light-headedness, numbness and headaches.  Hematological: Denies adenopathy. Easy bruising, personal or family bleeding history  Psychiatric/Behavioral: No anxiety or depression     Physical Exam:    BP 129/89   Pulse 65   Temp 98.2 F (36.8 C) (Temporal)   Resp 13   Ht '6\' 1"'$  (1.854 m)   Wt 279 lb (126.6 kg)   SpO2 97%   BMI 36.81 kg/m  Filed Weights   11/16/22 0714  Weight: 279 lb (126.6 kg)   Constitutional:  Well-developed, in no acute distress. Psychiatric: Normal mood and affect. Behavior is normal. HEENT: Pupils normal.  Conjunctivae are normal. No scleral icterus. Neck supple.  Cardiovascular: Normal rate, regular rhythm. No edema Pulmonary/chest: Effort normal and breath sounds normal. No wheezing, rales or rhonchi. Abdominal: Soft, nondistended. Nontender. Bowel sounds active throughout. There are no masses palpable. No hepatomegaly. Rectal:  defered Neurological: Alert and oriented to person place and time. Skin: Skin is warm and dry. No rashes noted.    Carmell Austria, MD 11/16/2022, 7:45 AM  Cc: Marge Duncans, PA-C

## 2022-11-16 NOTE — Progress Notes (Signed)
VS completed by CW.   Pt's states no medical or surgical changes since previsit or office visit.  

## 2022-11-16 NOTE — Progress Notes (Signed)
0731 Robinul 0.1 mg IV given due large amount of secretions upon assessment.  MD made aware, vss

## 2022-11-16 NOTE — Progress Notes (Signed)
Report given to PACU, vss 

## 2022-11-16 NOTE — Progress Notes (Signed)
Called to room to assist during endoscopic procedure.  Patient ID and intended procedure confirmed with present staff. Received instructions for my participation in the procedure from the performing physician.  

## 2022-11-17 ENCOUNTER — Telehealth: Payer: Self-pay | Admitting: Gastroenterology

## 2022-11-17 NOTE — Telephone Encounter (Signed)
  Follow up Call-     11/16/2022    7:15 AM 02/11/2021    9:35 AM  Call back number  Post procedure Call Back phone  # [phone]  Permission to leave phone message Yes Yes     Patient questions:  Do you have a fever, pain , or abdominal swelling? No. Pain Score  0 *  Have you tolerated food without any problems? Yes.    Have you been able to return to your normal activities? Yes.    Do you have any questions about your discharge instructions: Diet   No. Medications  No. Follow up visit  No.  Do you have questions or concerns about your Care? No.  Actions: * If pain score is 4 or above: No action needed, pain <4.

## 2022-11-17 NOTE — Telephone Encounter (Signed)
Pt is calling regarding his Protonix Rx, states that the pharmacy needs to speak with the doctor before giving it to him. Please advise

## 2022-11-17 NOTE — Telephone Encounter (Signed)
Pt pharmacy contacted: Pt pharmacy stated that medication is not covered by pt insurance although pt could go through [facility] and receive the medication 90 tablets for $24.73:  Pt made aware: Pt verbalized understanding with all questions answered.

## 2022-11-29 ENCOUNTER — Encounter: Payer: Self-pay | Admitting: Gastroenterology

## 2022-12-04 ENCOUNTER — Other Ambulatory Visit: Payer: Self-pay | Admitting: Family Medicine

## 2022-12-04 ENCOUNTER — Ambulatory Visit (INDEPENDENT_AMBULATORY_CARE_PROVIDER_SITE_OTHER): Payer: 59 | Admitting: Family Medicine

## 2022-12-04 ENCOUNTER — Encounter: Payer: Self-pay | Admitting: Family Medicine

## 2022-12-04 VITALS — BP 136/88 | Ht 74.0 in | Wt 280.0 lb

## 2022-12-04 DIAGNOSIS — M7742 Metatarsalgia, left foot: Secondary | ICD-10-CM | POA: Diagnosis not present

## 2022-12-04 DIAGNOSIS — M7741 Metatarsalgia, right foot: Secondary | ICD-10-CM

## 2022-12-04 HISTORY — DX: Metatarsalgia, right foot: M77.41

## 2022-12-04 MED ORDER — UREA 40 % EX CREA: TOPICAL_CREAM | CUTANEOUS | 3 refills | Status: DC

## 2022-12-04 NOTE — Assessment & Plan Note (Signed)
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  We placed him in some insoles with large metatarsal pads and this seemed to immediately give him some relief.  We also gave him information on how to buy these himself and instructed him on how to use them.  He can return as needed.  If this does not totally alleviate his issues, I would consider custom molded orthotics.

## 2022-12-04 NOTE — Progress Notes (Signed)
  Joshua Crawford - 56 y.o. male MRN [id number]  Date of birth: 1966-10-17    SUBJECTIVE:      Chief Complaint:/ HPI:  Bilateral foot pain pain many months.  Worsening.  Mostly in the forefoot.  He works in [employer] and stands most of the day and is starting to interfere with his activities.  He cannot do any extra walking which he wants to do for weight loss because his feet hurt all day.    OBJECTIVE: BP 136/88   Ht '6\' 2"'$  (1.88 m)   Wt 280 lb (127 kg)   BMI 35.95 kg/m   Physical Exam:  Vital signs are reviewed. GENERAL: Well-developed male no acute distress FEET: Bilaterally he has total loss of transverse arch.  He is tender to palpation on the right third metatarsal head and generally between the third and fourth metatarsal head on the left plantar portion of the foot.  Negative squeeze test bilaterally. SKIN: A lot of flaky dried skin both feet.  ASSESSMENT & PLAN:  See problem based charting & AVS for pt instructions. 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  We placed him in some insoles with large metatarsal pads and this seemed to immediately give him some relief.  We also gave him information on how to buy these himself and instructed him on how to use them.  He can return as needed.  If this does not totally alleviate his issues, I would consider custom molded orthotics. #2.  Excessive flaky skin on bilateral feet: He wears work boots all day.  I will place on prescription for 40% urea cream to be used twice daily bilateral feet.

## 2022-12-04 NOTE — Patient Instructions (Signed)
Start the cream I have Rx for you. Try the inserts into your shoes, wearing them as much as possible and let me see you back in January.  Great to meet you!

## 2023-03-15 ENCOUNTER — Ambulatory Visit: Payer: 59 | Admitting: Physician Assistant

## 2023-03-15 ENCOUNTER — Encounter: Payer: Self-pay | Admitting: Physician Assistant

## 2023-03-15 VITALS — BP 120/78 | HR 88 | Temp 96.9°F | Ht 74.0 in | Wt 282.2 lb

## 2023-03-15 DIAGNOSIS — E782 Mixed hyperlipidemia: Secondary | ICD-10-CM | POA: Diagnosis not present

## 2023-03-15 DIAGNOSIS — E1165 Type 2 diabetes mellitus with hyperglycemia: Secondary | ICD-10-CM | POA: Diagnosis not present

## 2023-03-15 NOTE — Progress Notes (Signed)
Established Patient Office Visit  Subjective:  Patient ID: Joshua Crawford, male    DOB: 16-Mar-1966  Age: 57 y.o. MRN: [id number]  CC:  Chief Complaint  Patient presents with   Diabetes   Hypertension    HPI Joshua Crawford presents for chronic follow up  Mixed hyperlipidemia  Pt presents with hyperlipidemia.  Compliance with treatment has been poor - pt states is trying to watch diet but stopped his medication several months ago - is due to repeat labwork  Pt with history of GERD - takes protonix as needed  Pt labwork showed he is diabetic - he currently is not on medication - does exercise and try to watch diet He is due for eye exam   Past Medical History:  Diagnosis Date   Acute midline low back pain without sciatica 10/02/2020   BMI 35.0-35.9,adult 10/21/2020   Cataract    removed x 1 eye ? which    Decreased libido 06/04/2022   Hyperlipidemia    Impaired fasting glucose 10/08/2020   Leukocytosis 10/08/2020   Mixed hyperlipidemia 10/08/2020   Obesity, diabetes, and hypertension syndrome ([place]) 10/02/2020   Other fatigue 06/04/2022   Prostate cancer screening 04/24/2021   Routine general medical examination at a health care facility 10/21/2020   Sleep apnea    doesnt wear cpap now - 01-28-21    Past Surgical History:  Procedure Laterality Date   CATARACT EXTRACTION     COLONOSCOPY  12/04/2015   Colonic polyp status post polypectomy. Small internal hemorrhoids. Hypoerplastic polyp.    Left Hand/Wrist Fracture w/ surgical repair     POLYPECTOMY     Right Knee Arthroscopy     Right Rotator Cuff Repair     UPPER GASTROINTESTINAL ENDOSCOPY      Family History  Problem Relation Age of Onset   Ovarian cancer Mother    Lymphoma Sister    Liver cancer Paternal Uncle    Colon polyps Neg Hx    Colon cancer Neg Hx    Esophageal cancer Neg Hx    Rectal cancer Neg Hx    Stomach cancer Neg Hx     Social History   Socioeconomic History   Marital status: Married     Spouse name: Not on file   Number of children: Not on file   Years of education: Not on file   Highest education level: Not on file  Occupational History   Occupation: unemployed  Tobacco Use   Smoking status: Former    Packs/day: 3.00    Years: 10.00    Additional pack years: 0.00    Total pack years: 30.00    Types: Cigarettes    Quit date: 2004    Years since quitting: 20.2   Smokeless tobacco: Never  Vaping Use   Vaping Use: Never used  Substance and Sexual Activity   Alcohol use: Yes    Alcohol/week: 2.0 standard drinks of alcohol    Types: 2 Cans of beer per week    Comment: once a week/occ   Drug use: Never   Sexual activity: Yes    Partners: Female  Other Topics Concern   Not on file  Social History Narrative   Not on file   Social Determinants of Health   Financial Resource Strain: Not on file  Food Insecurity: Not on file  Transportation Needs: Not on file  Physical Activity: Not on file  Stress: Not on file  Social Connections: Not on file  Intimate Partner Violence: Not  on file     Current Outpatient Medications:    cyclobenzaprine (FLEXERIL) 5 MG tablet, Take 1 tablet (5 mg total) by mouth at bedtime as needed for muscle spasms., Disp: 30 tablet, Rfl: 0   hydrocortisone (ANUSOL-HC) 2.5 % rectal cream, Place 1 Application rectally 2 (two) times daily. For 10 days to the rectum area and then use as needed, Disp: 30 g, Rfl: 6   ibuprofen (ADVIL) 600 MG tablet, Take 600 mg by mouth every 6 (six) hours as needed., Disp: , Rfl:    lidocaine (LIDODERM) 5 %, Place 1 patch onto the skin daily. Remove & Discard patch within 12 hours or as directed by MD, Disp: 30 patch, Rfl: 0   pantoprazole (PROTONIX) 40 MG tablet, Take 1 tablet (40 mg total) by mouth daily., Disp: 90 tablet, Rfl: 3   urea (CARMOL) 40 % CREA, Apply small amount to both feet once or twice daily, Disp: 198.4 g, Rfl: 3   Allergies  Allergen Reactions   Aleve [Naproxen Sodium] Anaphylaxis     CONSTITUTIONAL: Negative for chills, fatigue, fever, unintentional weight gain and unintentional weight loss.  E/N/T: Negative for ear pain, nasal congestion and sore throat.  CARDIOVASCULAR: Negative for chest pain, dizziness, palpitations and pedal edema.  RESPIRATORY: Negative for recent cough and dyspnea.  GASTROINTESTINAL: Negative for abdominal pain, acid reflux symptoms, constipation, diarrhea, nausea and vomiting.  MSK: Negative for arthralgias and myalgias.  INTEGUMENTARY: Negative for rash.  NEUROLOGICAL: Negative for dizziness and headaches.  PSYCHIATRIC: Negative for sleep disturbance and to question depression screen.  Negative for depression, negative for anhedonia.        Objective:  PHYSICAL EXAM:   VS: BP 120/78 (BP Location: Left Arm, Patient Position: Sitting, Cuff Size: Large)   Pulse 88   Temp (!) 96.9 F (36.1 C) (Temporal)   Ht 6\' 2"  (1.88 m)   Wt 282 lb 3.2 oz (128 kg)   SpO2 98%   BMI 36.23 kg/m   GEN: Well nourished, well developed, in no acute distress  Cardiac: RRR; no murmurs, rubs, or gallops,no edema -  Respiratory:  normal respiratory rate and pattern with no distress - normal breath sounds with no rales, rhonchi, wheezes or rubs MS: no deformity or atrophy  Skin: warm and dry, no rash  Psych: euthymic mood, appropriate affect and demeanor     Health Maintenance Due  Topic Date Due   DTaP/Tdap/Td (1 - Tdap) Never done   HEMOGLOBIN A1C  03/09/2023    There are no preventive care reminders to display for this patient.  Lab Results  Component Value Date   TSH 2.980 09/08/2022   Lab Results  Component Value Date   WBC 7.5 09/08/2022   HGB 15.9 09/08/2022   HCT 47.7 09/08/2022   MCV 88 09/08/2022   PLT 194 09/08/2022   Lab Results  Component Value Date   NA 138 09/08/2022   K 4.4 09/08/2022   CO2 24 09/08/2022   GLUCOSE 104 (H) 09/08/2022   BUN 14 09/08/2022   CREATININE 0.87 09/08/2022   BILITOT 0.5 09/08/2022   ALKPHOS  73 09/08/2022   AST 21 09/08/2022   ALT 28 09/08/2022   PROT 7.1 09/08/2022   ALBUMIN 4.6 09/08/2022   CALCIUM 9.3 09/08/2022   EGFR 101 09/08/2022   Lab Results  Component Value Date   CHOL 205 (H) 09/08/2022   Lab Results  Component Value Date   HDL 43 09/08/2022   Lab Results  Component Value  Date   LDLCALC 120 (H) 09/08/2022   Lab Results  Component Value Date   TRIG 237 (H) 09/08/2022   Lab Results  Component Value Date   CHOLHDL 4.8 09/08/2022   Lab Results  Component Value Date   HGBA1C 6.3 (H) 09/08/2022      Assessment & Plan:   Problem List Items Addressed This Visit       Other   Mixed hyperlipidemia   Relevant Orders   Lipid panel Watch diet   Other Visit Diagnoses     Type 2 diabetes mellitus with hyperglycemia, without long-term current use of insulin ([place])    -  Primary   Relevant Orders   CBC with Differential/Platelet   Comprehensive metabolic panel   Lipid panel   Hemoglobin A1c Watch diet  GERD Continue protonix       No orders of the defined types were placed in this encounter.   Follow-up: Return in about 6 months (around 09/15/2023) for chronic fasting follow up.    SARA R Analise Glotfelty, PA-C

## 2023-03-16 LAB — CBC WITH DIFFERENTIAL/PLATELET
Basophils Absolute: 0.1 10*3/uL (ref 0.0–0.2)
Basos: 1 %
EOS (ABSOLUTE): 0.1 10*3/uL (ref 0.0–0.4)
Eos: 2 %
Hematocrit: 48.3 % (ref 37.5–51.0)
Hemoglobin: 16.2 g/dL (ref 13.0–17.7)
Immature Grans (Abs): 0 10*3/uL (ref 0.0–0.1)
Immature Granulocytes: 0 %
Lymphocytes Absolute: 1.6 10*3/uL (ref 0.7–3.1)
Lymphs: 27 %
MCH: 28.7 pg (ref 26.6–33.0)
MCHC: 33.5 g/dL (ref 31.5–35.7)
MCV: 86 fL (ref 79–97)
Monocytes Absolute: 0.4 10*3/uL (ref 0.1–0.9)
Monocytes: 7 %
Neutrophils Absolute: 3.7 10*3/uL (ref 1.4–7.0)
Neutrophils: 63 %
Platelets: 213 10*3/uL (ref 150–450)
RBC: 5.64 x10E6/uL (ref 4.14–5.80)
RDW: 12.6 % (ref 11.6–15.4)
WBC: 5.9 10*3/uL (ref 3.4–10.8)

## 2023-03-16 LAB — COMPREHENSIVE METABOLIC PANEL
ALT: 39 IU/L (ref 0–44)
AST: 27 IU/L (ref 0–40)
Albumin/Globulin Ratio: 1.6 (ref 1.2–2.2)
Albumin: 4.6 g/dL (ref 3.8–4.9)
Alkaline Phosphatase: 79 IU/L (ref 44–121)
BUN/Creatinine Ratio: 12 (ref 9–20)
BUN: 13 mg/dL (ref 6–24)
Bilirubin Total: 0.5 mg/dL (ref 0.0–1.2)
CO2: 22 mmol/L (ref 20–29)
Calcium: 9.8 mg/dL (ref 8.7–10.2)
Chloride: 102 mmol/L (ref 96–106)
Creatinine, Ser: 1.06 mg/dL (ref 0.76–1.27)
Globulin, Total: 2.9 g/dL (ref 1.5–4.5)
Glucose: 130 mg/dL — ABNORMAL HIGH (ref 70–99)
Potassium: 5 mmol/L (ref 3.5–5.2)
Sodium: 141 mmol/L (ref 134–144)
Total Protein: 7.5 g/dL (ref 6.0–8.5)
eGFR: 82 mL/min/{1.73_m2} (ref >59)

## 2023-03-16 LAB — CARDIOVASCULAR RISK ASSESSMENT

## 2023-03-16 LAB — HEMOGLOBIN A1C
Est. average glucose Bld gHb Est-mCnc: 151 mg/dL
Hgb A1c MFr Bld: 6.9 % — ABNORMAL HIGH (ref 4.8–5.6)

## 2023-03-16 LAB — LIPID PANEL
Chol/HDL Ratio: 4.7 ratio (ref 0.0–5.0)
Cholesterol, Total: 193 mg/dL (ref 100–199)
HDL: 41 mg/dL (ref >39)
LDL Chol Calc (NIH): 125 mg/dL — ABNORMAL HIGH (ref 0–99)
Triglycerides: 149 mg/dL (ref 0–149)
VLDL Cholesterol Cal: 27 mg/dL (ref 5–40)

## 2023-03-19 ENCOUNTER — Other Ambulatory Visit: Payer: Self-pay | Admitting: Physician Assistant

## 2023-03-19 MED ORDER — ATORVASTATIN CALCIUM 10 MG PO TABS: 10.0000 mg | ORAL_TABLET | Freq: Every day | ORAL | 1 refills | Status: DC

## 2023-06-15 ENCOUNTER — Other Ambulatory Visit: Payer: Self-pay

## 2023-07-05 ENCOUNTER — Encounter: Payer: Self-pay | Admitting: Cardiology

## 2023-07-05 ENCOUNTER — Ambulatory Visit: Payer: 59 | Attending: Cardiology | Admitting: Cardiology

## 2023-07-05 VITALS — BP 130/94 | HR 78 | Ht 74.0 in | Wt 285.0 lb

## 2023-07-05 DIAGNOSIS — G4730 Sleep apnea, unspecified: Secondary | ICD-10-CM

## 2023-07-05 DIAGNOSIS — E669 Obesity, unspecified: Secondary | ICD-10-CM | POA: Diagnosis not present

## 2023-07-05 DIAGNOSIS — E782 Mixed hyperlipidemia: Secondary | ICD-10-CM | POA: Diagnosis not present

## 2023-07-05 DIAGNOSIS — R030 Elevated blood-pressure reading, without diagnosis of hypertension: Secondary | ICD-10-CM | POA: Diagnosis not present

## 2023-07-05 MED ORDER — ATORVASTATIN CALCIUM 10 MG PO TABS: 10.0000 mg | ORAL_TABLET | Freq: Every day | ORAL | 3 refills | Status: DC

## 2023-07-05 NOTE — Progress Notes (Signed)
Cardiology Office Note:    Date:  07/05/2023   ID:  Joshua Crawford, DOB November 19, 1966, MRN [id number]  PCP:  Marianne Sofia, PA-C  Cardiologist:  Garwin Brothers, MD   Referring MD: Marianne Sofia, PA-C    ASSESSMENT:    1. Sleep apnea, unspecified type   2. Elevated blood pressure reading in office without diagnosis of hypertension   3. Mixed hyperlipidemia   4. Obesity (BMI 35.0-39.9 without comorbidity)    PLAN:    In order of problems listed above:  Primary prevention stressed with the patient.  Importance of compliance with diet medication stressed and patient verbalized standing. Elevated blood pressure without diagnosis of hypertension: I discussed lifestyle modification, diet and salt intake issues with him at length and he vocalized understanding and questions were answered to satisfaction.  He is going to keep a track of his blood pressures at home in the next few days and get back to me.  I told him to walk at least half an hour a day 5 days a week and he promises to do so.   Mixed dyslipidemia: On lipid-lowering medications followed by primary care.  Lipids reviewed and discussed with him. Obesity: Weight reduction stressed.  Diet emphasized and he promises to do better.  Risks of obesity explained. Patient will be seen in follow-up appointment in 6 months or earlier if the patient has any concerns.   Medication Adjustments/Labs and Tests Ordered: Current medicines are reviewed at length with the patient today.  Concerns regarding medicines are outlined above.  Orders Placed This Encounter  Procedures   EKG 12-Lead   Meds ordered this encounter  Medications   atorvastatin (LIPITOR) 10 MG tablet    Sig: Take 1 tablet (10 mg total) by mouth daily.    Dispense:  90 tablet    Refill:  3     No chief complaint on file.    History of Present Illness:    Joshua Crawford is a 57 y.o. male.  Patient has past medical history of mixed dyslipidemia.  He had elevated blood  pressure at the office.  He denies any history of hypertension.  He is obese and leads a sedentary lifestyle.  He has a full-time job.  He denies any chest pain orthopnea or PND.  At the time of my evaluation, the patient is alert awake oriented and in no distress.  Past Medical History:  Diagnosis Date   Acute bilateral low back pain with sciatica 09/10/2022   Acute midline low back pain without sciatica 10/02/2020   BMI 35.0-35.9,adult 10/21/2020   Cataract    removed x 1 eye ? which    Decreased libido 06/04/2022   Dyspnea on exertion 06/19/2022   Elevated blood pressure reading in office without diagnosis of hypertension 06/19/2022   Hyperlipidemia    Impaired fasting glucose 10/08/2020   Leukocytosis 10/08/2020   Metatarsalgia of both feet 12/04/2022   Mixed hyperlipidemia 10/08/2020   Obesity (BMI 35.0-39.9 without comorbidity) 06/19/2022   Obesity, diabetes, and hypertension syndrome ([facility]) 10/02/2020   Other fatigue 06/04/2022   Prostate cancer screening 04/24/2021   Routine general medical examination at a health care facility 10/21/2020   Routine medical exam 09/08/2022   Sleep apnea    doesnt wear cpap now - 01-28-21   Testosterone deficiency 09/08/2022    Past Surgical History:  Procedure Laterality Date   CATARACT EXTRACTION     COLONOSCOPY  12/04/2015   Colonic polyp status post polypectomy. Small internal hemorrhoids. Hypoerplastic  polyp.    Left Hand/Wrist Fracture w/ surgical repair     POLYPECTOMY     Right Knee Arthroscopy     Right Rotator Cuff Repair     UPPER GASTROINTESTINAL ENDOSCOPY      Current Medications: Current Meds  Medication Sig   cyclobenzaprine (FLEXERIL) 5 MG tablet Take 1 tablet (5 mg total) by mouth at bedtime as needed for muscle spasms.   hydrocortisone (ANUSOL-HC) 2.5 % rectal cream Place 1 Application rectally 2 (two) times daily. For 10 days to the rectum area and then use as needed   ibuprofen (ADVIL) 600 MG tablet Take 600 mg by  mouth every 6 (six) hours as needed for headache or mild pain.   pantoprazole (PROTONIX) 40 MG tablet Take 1 tablet (40 mg total) by mouth daily.   urea (CARMOL) 40 % CREA Apply small amount to both feet once or twice daily     Allergies:   Aleve [naproxen sodium]   Social History   Socioeconomic History   Marital status: Married    Spouse name: Not on file   Number of children: Not on file   Years of education: Not on file   Highest education level: Not on file  Occupational History   Occupation: unemployed  Tobacco Use   Smoking status: Former    Packs/day: 3.00    Years: 10.00    Additional pack years: 0.00    Total pack years: 30.00    Types: Cigarettes    Quit date: 2004    Years since quitting: 20.5   Smokeless tobacco: Never  Vaping Use   Vaping Use: Never used  Substance and Sexual Activity   Alcohol use: Yes    Alcohol/week: 2.0 standard drinks of alcohol    Types: 2 Cans of beer per week    Comment: once a week/occ   Drug use: Never   Sexual activity: Yes    Partners: Female  Other Topics Concern   Not on file  Social History Narrative   Not on file   Social Determinants of Health   Financial Resource Strain: Not on file  Food Insecurity: Not on file  Transportation Needs: Not on file  Physical Activity: Not on file  Stress: Not on file  Social Connections: Not on file     Family History: The patient's family history includes Liver cancer in his paternal uncle; Lymphoma in his sister; Ovarian cancer in his mother. There is no history of Colon polyps, Colon cancer, Esophageal cancer, Rectal cancer, or Stomach cancer.  ROS:   Please see the history of present illness.    All other systems reviewed and are negative.  EKGs/Labs/Other Studies Reviewed:    The following studies were reviewed today: I discussed my findings with the patient at length.   Recent Labs: 09/08/2022: TSH 2.980 03/15/2023: ALT 39; BUN 13; Creatinine, Ser 1.06; Hemoglobin  16.2; Platelets 213; Potassium 5.0; Sodium 141  Recent Lipid Panel    Component Value Date/Time   CHOL 193 03/15/2023 0945   TRIG 149 03/15/2023 0945   HDL 41 03/15/2023 0945   CHOLHDL 4.7 03/15/2023 0945   LDLCALC 125 (H) 03/15/2023 0945    Physical Exam:    VS:  BP (!) 130/94   Pulse 78   Ht 6\' 2"  (1.88 m)   Wt 285 lb (129.3 kg)   SpO2 95%   BMI 36.59 kg/m     Wt Readings from Last 3 Encounters:  07/05/23 285 lb (129.3 kg)  03/15/23 282 lb 3.2 oz (128 kg)  12/04/22 280 lb (127 kg)     GEN: Patient is in no acute distress HEENT: Normal NECK: No JVD; No carotid bruits LYMPHATICS: No lymphadenopathy CARDIAC: Hear sounds regular, 2/6 systolic murmur at the apex. RESPIRATORY:  Clear to auscultation without rales, wheezing or rhonchi  ABDOMEN: Soft, non-tender, non-distended MUSCULOSKELETAL:  No edema; No deformity  SKIN: Warm and dry NEUROLOGIC:  Alert and oriented x 3 PSYCHIATRIC:  Normal affect   Signed, Garwin Brothers, MD  07/05/2023 2:39 PM    [facility]

## 2023-07-05 NOTE — Patient Instructions (Signed)
Please keep a BP log for 2 weeks and send by MyChart or mail.  Blood Pressure Record Sheet To take your blood pressure, you will need a blood pressure machine. You can buy a blood pressure machine (blood pressure monitor) at your clinic, drug store, or online. When choosing one, consider: An automatic monitor that has an arm cuff. A cuff that wraps snugly around your upper arm. You should be able to fit only one finger between your arm and the cuff. A device that stores blood pressure reading results. Do not choose a monitor that measures your blood pressure from your wrist or finger. Follow your health care provider's instructions for how to take your blood pressure. To use this form: Get one reading in the morning (a.m.) 1-2 hours after you take any medicines. Get one reading in the evening (p.m.) before supper.   Blood pressure log Date: _______________________  a.m. _____________________(1st reading) HR___________            p.m. _____________________(2nd reading) HR__________  Date: _______________________  a.m. _____________________(1st reading) HR___________            p.m. _____________________(2nd reading) HR__________  Date: _______________________  a.m. _____________________(1st reading) HR___________            p.m. _____________________(2nd reading) HR__________  Date: _______________________  a.m. _____________________(1st reading) HR___________            p.m. _____________________(2nd reading) HR__________  Date: _______________________  a.m. _____________________(1st reading) HR___________            p.m. _____________________(2nd reading) HR__________  Date: _______________________  a.m. _____________________(1st reading) HR___________            p.m. _____________________(2nd reading) HR__________  Date: _______________________  a.m. _____________________(1st reading) HR___________            p.m. _____________________(2nd reading)  HR__________   This information is not intended to replace advice given to you by your health care provider. Make sure you discuss any questions you have with your health care provider. Document Revised: 04/03/2020 Document Reviewed: 04/03/2020 Elsevier Patient Education  2021 Elsevier Inc.   .Medication Instructions:  Your physician recommends that you continue on your current medications as directed. Please refer to the Current Medication list given to you today.  *If you need a refill on your cardiac medications before your next appointment, please call your pharmacy*   Lab Work: None ordered If you have labs (blood work) drawn today and your tests are completely normal, you will receive your results only by: MyChart Message (if you have MyChart) OR A paper copy in the mail If you have any lab test that is abnormal or we need to change your treatment, we will call you to review the results.   Testing/Procedures: None ordered   Follow-Up: At [facility], you and your health needs are our priority.  As part of our continuing mission to provide you with exceptional heart care, we have created designated Provider Care Teams.  These Care Teams include your primary Cardiologist (physician) and Advanced Practice Providers (APPs -  Physician Assistants and Nurse Practitioners) who all work together to provide you with the care you need, when you need it.  We recommend signing up for the patient portal called "MyChart".  Sign up information is provided on this After Visit Summary.  MyChart is used to connect with patients for Virtual Visits (Telemedicine).  Patients are able to view lab/test results, encounter notes, upcoming appointments, etc.  Non-urgent messages can be sent to your  provider as well.   To learn more about what you can do with MyChart, go to [email or web address].    Your next appointment:   12 month(s)  The format for your next appointment:   In  Person  Provider:   Belva Crome, MD    Other Instructions none  Important Information About Sugar

## 2023-09-17 ENCOUNTER — Encounter: Payer: 59 | Admitting: Physician Assistant

## 2023-10-11 NOTE — Progress Notes (Unsigned)
Subjective:  Patient ID: Joshua Crawford, male    DOB: [birth year]  Age: 57 y.o. MRN: [id number]  Chief Complaint  Patient presents with   Annual Exam    HPI  Well Adult Physical: Patient here for a comprehensive physical exam.The patient reports {problems:16946} Do you take any herbs or supplements that were not prescribed by a doctor? {yes/no/not asked:9010} Are you taking calcium supplements? {yes/no:63} Are you taking aspirin daily? {yes/no:63}  Encounter for general adult medical examination without abnormal findings  Physical ("At Risk" items are starred): Patient's last physical exam was 1 year ago .  Patient wears a seat belt, has smoke detectors, has carbon monoxide detectors, practices appropriate gun safety, and wears sunscreen with extended sun exposure. Dental Care: biannual cleanings, brushes and flosses daily. Ophthalmology/Optometry: Annual visit.  Hearing loss: none Vision impairments: none Last PSA:     03/15/2023    9:18 AM 09/08/2022   11:23 AM 09/08/2022   11:15 AM 11/06/2021   10:17 AM 10/21/2020    2:03 PM  Depression screen PHQ 2/9  Decreased Interest 0 0 0 0 0  Down, Depressed, Hopeless 0 0 0 0 0  PHQ - 2 Score 0 0 0 0 0  Altered sleeping  3 3    Tired, decreased energy  3 3    Change in appetite  0 0    Feeling bad or failure about yourself   0 0    Trouble concentrating  0 0    Moving slowly or fidgety/restless  0 0    Suicidal thoughts  0 0    PHQ-9 Score  6 6    Difficult doing work/chores  Not difficult at all Not difficult at all           10/21/2020    1:51 PM 10/21/2020    2:02 PM 03/15/2023    9:18 AM  Fall Risk  Falls in the past year? 0 0 0  Was there an injury with Fall? 0 0 0  Fall Risk Category Calculator 0 0 0  Fall Risk Category (Retired) Low Low   (RETIRED) Patient Fall Risk Level Low fall risk    Patient at Risk for Falls Due to  History of fall(s) No Fall Risks  Fall risk Follow up   Falls evaluation completed               Past Medical History:  Diagnosis Date   Acute bilateral low back pain with sciatica 09/10/2022   Acute midline low back pain without sciatica 10/02/2020   BMI 35.0-35.9,adult 10/21/2020   Cataract    removed x 1 eye ? which    Decreased libido 06/04/2022   Dyspnea on exertion 06/19/2022   Elevated blood pressure reading in office without diagnosis of hypertension 06/19/2022   Hyperlipidemia    Impaired fasting glucose 10/08/2020   Leukocytosis 10/08/2020   Metatarsalgia of both feet 12/04/2022   Mixed hyperlipidemia 10/08/2020   Obesity (BMI 35.0-39.9 without comorbidity) 06/19/2022   Obesity, diabetes, and hypertension syndrome (HCC) 10/02/2020   Other fatigue 06/04/2022   Prostate cancer screening 04/24/2021   Routine general medical examination at a health care facility 10/21/2020   Routine medical exam 09/08/2022   Sleep apnea    doesnt wear cpap now - 01-28-21   Testosterone deficiency 09/08/2022   Past Surgical History:  Procedure Laterality Date   CATARACT EXTRACTION     COLONOSCOPY  12/04/2015   Colonic polyp status post polypectomy. Small internal hemorrhoids. Hypoerplastic  polyp.    Left Hand/Wrist Fracture w/ surgical repair     POLYPECTOMY     Right Knee Arthroscopy     Right Rotator Cuff Repair     UPPER GASTROINTESTINAL ENDOSCOPY      Family History  Problem Relation Age of Onset   Ovarian cancer Mother    Lymphoma Sister    Liver cancer Paternal Uncle    Colon polyps Neg Hx    Colon cancer Neg Hx    Esophageal cancer Neg Hx    Rectal cancer Neg Hx    Stomach cancer Neg Hx    Social History   Socioeconomic History   Marital status: Married    Spouse name: Not on file   Number of children: Not on file   Years of education: Not on file   Highest education level: Not on file  Occupational History   Occupation: unemployed  Tobacco Use   Smoking status: Former    Current packs/day: 0.00    Average packs/day: 3.0 packs/day for 10.0 years (30.0  ttl pk-yrs)    Types: Cigarettes    Start date: 22    Quit date: 2004    Years since quitting: 20.8   Smokeless tobacco: Never  Vaping Use   Vaping status: Never Used  Substance and Sexual Activity   Alcohol use: Yes    Alcohol/week: 2.0 standard drinks of alcohol    Types: 2 Cans of beer per week    Comment: once a week/occ   Drug use: Never   Sexual activity: Yes    Partners: Female  Other Topics Concern   Not on file  Social History Narrative   Not on file   Social Determinants of Health   Financial Resource Strain: Not on file  Food Insecurity: Not on file  Transportation Needs: Not on file  Physical Activity: Not on file  Stress: Not on file  Social Connections: Not on file   Review of Systems   Objective:  There were no vitals taken for this visit.     07/05/2023    2:35 PM 07/05/2023    2:11 PM 03/15/2023    9:16 AM  BP/Weight  Systolic BP 130 140 120  Diastolic BP 94 94 78  Wt. (Lbs)  285 282.2  BMI  36.59 kg/m2 36.23 kg/m2    Physical Exam  Lab Results  Component Value Date   WBC 5.9 03/15/2023   HGB 16.2 03/15/2023   HCT 48.3 03/15/2023   PLT 213 03/15/2023   GLUCOSE 130 (H) 03/15/2023   CHOL 193 03/15/2023   TRIG 149 03/15/2023   HDL 41 03/15/2023   LDLCALC 125 (H) 03/15/2023   ALT 39 03/15/2023   AST 27 03/15/2023   NA 141 03/15/2023   K 5.0 03/15/2023   CL 102 03/15/2023   CREATININE 1.06 03/15/2023   BUN 13 03/15/2023   CO2 22 03/15/2023   TSH 2.980 09/08/2022   HGBA1C 6.9 (H) 03/15/2023      Assessment & Plan:  There are no diagnoses linked to this encounter.   There is no height or weight on file to calculate BMI.   These are the goals we discussed:  Goals   None      This is a list of the screening recommended for you and due dates:  Health Maintenance  Topic Date Due   Eye exam for diabetics  Never done   Yearly kidney health urinalysis for diabetes  Never done   DTaP/Tdap/Td  vaccine (1 - Tdap) Never done    Zoster (Shingles) Vaccine (1 of 2) Never done   Flu Shot  07/29/2023   Hemoglobin A1C  09/15/2023   Yearly kidney function blood test for diabetes  03/14/2024   Complete foot exam   03/14/2024   Colon Cancer Screening  02/12/2028   HPV Vaccine  Aged Out   COVID-19 Vaccine  Discontinued   Hepatitis C Screening  Discontinued   HIV Screening  Discontinued     No orders of the defined types were placed in this encounter.    Follow-up: No follow-ups on file.  An After Visit Summary was printed and given to the patient.  Langley Gauss, [facility] [phone]

## 2023-10-12 ENCOUNTER — Encounter: Payer: 59 | Admitting: Physician Assistant

## 2023-10-13 NOTE — Progress Notes (Signed)
This encounter was created in error - please disregard.

## 2023-10-27 NOTE — Progress Notes (Unsigned)
Subjective:  Patient ID: Joshua Crawford, male    DOB: [birth year]  Age: 57 y.o. MRN: [id number]  Chief Complaint  Patient presents with   Annual Exam    HPI  Well Adult Physical: Patient here for a comprehensive physical exam.The patient reports {problems:16946} Do you take any herbs or supplements that were not prescribed by a doctor? {yes/no/not asked:9010} Are you taking calcium supplements? {yes/no:63} Are you taking aspirin daily? {yes/no:63}  Encounter for general adult medical examination without abnormal findings  Physical ("At Risk" items are starred): Patient's last physical exam was 1 year ago .  Patient wears a seat belt, has smoke detectors, has carbon monoxide detectors, practices appropriate gun safety, and wears sunscreen with extended sun exposure. Dental Care: biannual cleanings, brushes and flosses daily. Ophthalmology/Optometry: Annual visit.  Hearing loss: none Vision impairments: none Last PSA:     03/15/2023    9:18 AM 09/08/2022   11:23 AM 09/08/2022   11:15 AM 11/06/2021   10:17 AM 10/21/2020    2:03 PM  Depression screen PHQ 2/9  Decreased Interest 0 0 0 0 0  Down, Depressed, Hopeless 0 0 0 0 0  PHQ - 2 Score 0 0 0 0 0  Altered sleeping  3 3    Tired, decreased energy  3 3    Change in appetite  0 0    Feeling bad or failure about yourself   0 0    Trouble concentrating  0 0    Moving slowly or fidgety/restless  0 0    Suicidal thoughts  0 0    PHQ-9 Score  6 6    Difficult doing work/chores  Not difficult at all Not difficult at all           10/21/2020    1:51 PM 10/21/2020    2:02 PM 03/15/2023    9:18 AM  Fall Risk  Falls in the past year? 0 0 0  Was there an injury with Fall? 0 0 0  Fall Risk Category Calculator 0 0 0  Fall Risk Category (Retired) Low Low   (RETIRED) Patient Fall Risk Level Low fall risk    Patient at Risk for Falls Due to  History of fall(s) No Fall Risks  Fall risk Follow up   Falls evaluation completed               Past Medical History:  Diagnosis Date   Acute bilateral low back pain with sciatica 09/10/2022   Acute midline low back pain without sciatica 10/02/2020   BMI 35.0-35.9,adult 10/21/2020   Cataract    removed x 1 eye ? which    Decreased libido 06/04/2022   Dyspnea on exertion 06/19/2022   Elevated blood pressure reading in office without diagnosis of hypertension 06/19/2022   Hyperlipidemia    Impaired fasting glucose 10/08/2020   Leukocytosis 10/08/2020   Metatarsalgia of both feet 12/04/2022   Mixed hyperlipidemia 10/08/2020   Obesity (BMI 35.0-39.9 without comorbidity) 06/19/2022   Obesity, diabetes, and hypertension syndrome (HCC) 10/02/2020   Other fatigue 06/04/2022   Prostate cancer screening 04/24/2021   Routine general medical examination at a health care facility 10/21/2020   Routine medical exam 09/08/2022   Sleep apnea    doesnt wear cpap now - 01-28-21   Testosterone deficiency 09/08/2022   Past Surgical History:  Procedure Laterality Date   CATARACT EXTRACTION     COLONOSCOPY  12/04/2015   Colonic polyp status post polypectomy. Small internal hemorrhoids. Hypoerplastic  polyp.    Left Hand/Wrist Fracture w/ surgical repair     POLYPECTOMY     Right Knee Arthroscopy     Right Rotator Cuff Repair     UPPER GASTROINTESTINAL ENDOSCOPY      Family History  Problem Relation Age of Onset   Ovarian cancer Mother    Lymphoma Sister    Liver cancer Paternal Uncle    Colon polyps Neg Hx    Colon cancer Neg Hx    Esophageal cancer Neg Hx    Rectal cancer Neg Hx    Stomach cancer Neg Hx    Social History   Socioeconomic History   Marital status: Married    Spouse name: Not on file   Number of children: Not on file   Years of education: Not on file   Highest education level: Not on file  Occupational History   Occupation: unemployed  Tobacco Use   Smoking status: Former    Current packs/day: 0.00    Average packs/day: 3.0 packs/day for 10.0 years (30.0  ttl pk-yrs)    Types: Cigarettes    Start date: 22    Quit date: 2004    Years since quitting: 20.8   Smokeless tobacco: Never  Vaping Use   Vaping status: Never Used  Substance and Sexual Activity   Alcohol use: Yes    Alcohol/week: 2.0 standard drinks of alcohol    Types: 2 Cans of beer per week    Comment: once a week/occ   Drug use: Never   Sexual activity: Yes    Partners: Female  Other Topics Concern   Not on file  Social History Narrative   Not on file   Social Determinants of Health   Financial Resource Strain: Not on file  Food Insecurity: Not on file  Transportation Needs: Not on file  Physical Activity: Not on file  Stress: Not on file  Social Connections: Not on file   Review of Systems   Objective:  There were no vitals taken for this visit.     07/05/2023    2:35 PM 07/05/2023    2:11 PM 03/15/2023    9:16 AM  BP/Weight  Systolic BP 130 140 120  Diastolic BP 94 94 78  Wt. (Lbs)  285 282.2  BMI  36.59 kg/m2 36.23 kg/m2    Physical Exam  Lab Results  Component Value Date   WBC 5.9 03/15/2023   HGB 16.2 03/15/2023   HCT 48.3 03/15/2023   PLT 213 03/15/2023   GLUCOSE 130 (H) 03/15/2023   CHOL 193 03/15/2023   TRIG 149 03/15/2023   HDL 41 03/15/2023   LDLCALC 125 (H) 03/15/2023   ALT 39 03/15/2023   AST 27 03/15/2023   NA 141 03/15/2023   K 5.0 03/15/2023   CL 102 03/15/2023   CREATININE 1.06 03/15/2023   BUN 13 03/15/2023   CO2 22 03/15/2023   TSH 2.980 09/08/2022   HGBA1C 6.9 (H) 03/15/2023      Assessment & Plan:  There are no diagnoses linked to this encounter.   There is no height or weight on file to calculate BMI.   These are the goals we discussed:  Goals   None      This is a list of the screening recommended for you and due dates:  Health Maintenance  Topic Date Due   Eye exam for diabetics  Never done   Yearly kidney health urinalysis for diabetes  Never done   DTaP/Tdap/Td  vaccine (1 - Tdap) Never done    Zoster (Shingles) Vaccine (1 of 2) Never done   Flu Shot  07/29/2023   Hemoglobin A1C  09/15/2023   Yearly kidney function blood test for diabetes  03/14/2024   Complete foot exam   03/14/2024   Colon Cancer Screening  02/12/2028   HPV Vaccine  Aged Out   COVID-19 Vaccine  Discontinued   Hepatitis C Screening  Discontinued   HIV Screening  Discontinued     No orders of the defined types were placed in this encounter.    Follow-up: No follow-ups on file.  An After Visit Summary was printed and given to the patient.  Langley Gauss, [facility] [phone]

## 2023-10-28 ENCOUNTER — Encounter: Payer: Self-pay | Admitting: Physician Assistant

## 2023-10-28 ENCOUNTER — Ambulatory Visit (INDEPENDENT_AMBULATORY_CARE_PROVIDER_SITE_OTHER): Payer: 59 | Admitting: Physician Assistant

## 2023-10-28 VITALS — BP 128/72 | HR 63 | Temp 97.8°F | Ht 74.0 in | Wt 271.0 lb

## 2023-10-28 DIAGNOSIS — R202 Paresthesia of skin: Secondary | ICD-10-CM

## 2023-10-28 DIAGNOSIS — R7301 Impaired fasting glucose: Secondary | ICD-10-CM

## 2023-10-28 DIAGNOSIS — Z23 Encounter for immunization: Secondary | ICD-10-CM

## 2023-10-28 DIAGNOSIS — Z125 Encounter for screening for malignant neoplasm of prostate: Secondary | ICD-10-CM | POA: Insufficient documentation

## 2023-10-28 DIAGNOSIS — R200 Anesthesia of skin: Secondary | ICD-10-CM | POA: Diagnosis not present

## 2023-10-28 DIAGNOSIS — Z0000 Encounter for general adult medical examination without abnormal findings: Secondary | ICD-10-CM

## 2023-10-28 DIAGNOSIS — S838X1A Sprain of other specified parts of right knee, initial encounter: Secondary | ICD-10-CM | POA: Insufficient documentation

## 2023-10-28 NOTE — Assessment & Plan Note (Signed)
Labs drawn Has seen a podiatrist before that gave him pads for the base of his toes Has new pads he can put in If he doesn't we will refer to podiatry here for a new assessment

## 2023-10-28 NOTE — Assessment & Plan Note (Signed)
Labs drawn today Continue monitoring diet and exercise Will adjust treatment depending on results

## 2023-10-28 NOTE — Assessment & Plan Note (Signed)
Admits to right knee pain after increasing walking Abnormal fasting glucose

## 2023-10-28 NOTE — Assessment & Plan Note (Signed)
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

## 2023-10-28 NOTE — Assessment & Plan Note (Signed)
Labs drawn today Will refer to urology if elevated

## 2023-10-29 ENCOUNTER — Other Ambulatory Visit: Payer: Self-pay | Admitting: Physician Assistant

## 2023-10-29 LAB — CBC WITH DIFFERENTIAL/PLATELET
Basophils Absolute: 0 10*3/uL (ref 0.0–0.2)
Basos: 1 %
EOS (ABSOLUTE): 0.1 10*3/uL (ref 0.0–0.4)
Eos: 2 %
Hematocrit: 45.6 % (ref 37.5–51.0)
Hemoglobin: 14.9 g/dL (ref 13.0–17.7)
Immature Grans (Abs): 0 10*3/uL (ref 0.0–0.1)
Immature Granulocytes: 0 %
Lymphocytes Absolute: 1.6 10*3/uL (ref 0.7–3.1)
Lymphs: 29 %
MCH: 29.1 pg (ref 26.6–33.0)
MCHC: 32.7 g/dL (ref 31.5–35.7)
MCV: 89 fL (ref 79–97)
Monocytes Absolute: 0.5 10*3/uL (ref 0.1–0.9)
Monocytes: 9 %
Neutrophils Absolute: 3.3 10*3/uL (ref 1.4–7.0)
Neutrophils: 59 %
Platelets: 202 10*3/uL (ref 150–450)
RBC: 5.12 x10E6/uL (ref 4.14–5.80)
RDW: 12.7 % (ref 11.6–15.4)
WBC: 5.5 10*3/uL (ref 3.4–10.8)

## 2023-10-29 LAB — B12 AND FOLATE PANEL
Folate: 9 ng/mL (ref >3.0)
Vitamin B-12: 672 pg/mL (ref 232–1245)

## 2023-10-29 LAB — COMPREHENSIVE METABOLIC PANEL
ALT: 26 [IU]/L (ref 0–44)
AST: 22 [IU]/L (ref 0–40)
Albumin: 4.6 g/dL (ref 3.8–4.9)
Alkaline Phosphatase: 75 [IU]/L (ref 44–121)
BUN/Creatinine Ratio: 13 (ref 9–20)
BUN: 13 mg/dL (ref 6–24)
Bilirubin Total: 0.6 mg/dL (ref 0.0–1.2)
CO2: 24 mmol/L (ref 20–29)
Calcium: 9.1 mg/dL (ref 8.7–10.2)
Chloride: 103 mmol/L (ref 96–106)
Creatinine, Ser: 1.01 mg/dL (ref 0.76–1.27)
Globulin, Total: 2.3 g/dL (ref 1.5–4.5)
Glucose: 120 mg/dL — ABNORMAL HIGH (ref 70–99)
Potassium: 4.8 mmol/L (ref 3.5–5.2)
Sodium: 140 mmol/L (ref 134–144)
Total Protein: 6.9 g/dL (ref 6.0–8.5)
eGFR: 86 mL/min/{1.73_m2} (ref >59)

## 2023-10-29 LAB — LIPID PANEL
Chol/HDL Ratio: 4.1 ratio (ref 0.0–5.0)
Cholesterol, Total: 165 mg/dL (ref 100–199)
HDL: 40 mg/dL (ref >39)
LDL Chol Calc (NIH): 109 mg/dL — ABNORMAL HIGH (ref 0–99)
Triglycerides: 82 mg/dL (ref 0–149)
VLDL Cholesterol Cal: 16 mg/dL (ref 5–40)

## 2023-10-29 LAB — TSH: TSH: 2.35 u[IU]/mL (ref 0.450–4.500)

## 2023-10-29 LAB — HEMOGLOBIN A1C
Est. average glucose Bld gHb Est-mCnc: 131 mg/dL
Hgb A1c MFr Bld: 6.2 % — ABNORMAL HIGH (ref 4.8–5.6)

## 2023-10-29 LAB — PSA: Prostate Specific Ag, Serum: 1.7 ng/mL (ref 0.0–4.0)

## 2023-10-29 LAB — T4, FREE: Free T4: 1.09 ng/dL (ref 0.82–1.77)

## 2023-11-10 ENCOUNTER — Other Ambulatory Visit: Payer: Self-pay | Admitting: Gastroenterology

## 2024-03-01 ENCOUNTER — Encounter: Payer: Self-pay | Admitting: Physician Assistant

## 2024-03-01 ENCOUNTER — Ambulatory Visit: Payer: 59 | Admitting: Physician Assistant

## 2024-03-01 VITALS — BP 110/74 | HR 85 | Temp 97.5°F | Resp 18 | Ht 74.0 in | Wt 275.4 lb

## 2024-03-01 DIAGNOSIS — K219 Gastro-esophageal reflux disease without esophagitis: Secondary | ICD-10-CM

## 2024-03-01 DIAGNOSIS — E782 Mixed hyperlipidemia: Secondary | ICD-10-CM | POA: Diagnosis not present

## 2024-03-01 DIAGNOSIS — E1165 Type 2 diabetes mellitus with hyperglycemia: Secondary | ICD-10-CM

## 2024-03-01 DIAGNOSIS — R234 Changes in skin texture: Secondary | ICD-10-CM

## 2024-03-01 MED ORDER — UREA 40 % EX CREA: TOPICAL_CREAM | CUTANEOUS | 3 refills | Status: AC

## 2024-03-01 MED ORDER — PANTOPRAZOLE SODIUM 40 MG PO TBEC: 40.0000 mg | DELAYED_RELEASE_TABLET | Freq: Every day | ORAL | 1 refills | Status: AC

## 2024-03-01 NOTE — Progress Notes (Signed)
 Established Patient Office Visit  Subjective:  Patient ID: Joshua Crawford, male    DOB: September 09, 1966  Age: 58 y.o. MRN: [id number]  CC:  Chief Complaint  Patient presents with   Medical Management of Chronic Issues    HPI Joshua Crawford presents for chronic follow up  Mixed hyperlipidemia  Pt presents with hyperlipidemia.  Compliance with treatment has been poor - pt states is trying to watch diet but stopped his medication several months ago - is due to repeat labwork  Pt with history of GERD - takes protonix as needed and requests refill of medication  Pt labwork showed he is diabetic - he currently is not on medication and does not check glucose- does exercise and try to watch diet -  He is up to date on eye exam  Pt with dry , cracked feet - requests refill of urea cream Past Medical History:  Diagnosis Date   Acute bilateral low back pain with sciatica 09/10/2022   Acute midline low back pain without sciatica 10/02/2020   BMI 35.0-35.9,adult 10/21/2020   Cataract    removed x 1 eye ? which    Decreased libido 06/04/2022   Dyspnea on exertion 06/19/2022   Elevated blood pressure reading in office without diagnosis of hypertension 06/19/2022   Hyperlipidemia    Impaired fasting glucose 10/08/2020   Leukocytosis 10/08/2020   Metatarsalgia of both feet 12/04/2022   Mixed hyperlipidemia 10/08/2020   Obesity (BMI 35.0-39.9 without comorbidity) 06/19/2022   Obesity, diabetes, and hypertension syndrome (HCC) 10/02/2020   Other fatigue 06/04/2022   Prostate cancer screening 04/24/2021   Routine general medical examination at a health care facility 10/21/2020   Routine medical exam 09/08/2022   Sleep apnea    doesnt wear cpap now - 01-28-21   Testosterone deficiency 09/08/2022    Past Surgical History:  Procedure Laterality Date   CATARACT EXTRACTION     COLONOSCOPY  12/04/2015   Colonic polyp status post polypectomy. Small internal hemorrhoids. Hypoerplastic polyp.     Left Hand/Wrist Fracture w/ surgical repair     POLYPECTOMY     Right Knee Arthroscopy     Right Rotator Cuff Repair     UPPER GASTROINTESTINAL ENDOSCOPY      Family History  Problem Relation Age of Onset   Ovarian cancer Mother    Lymphoma Sister    Liver cancer Paternal Uncle    Colon polyps Neg Hx    Colon cancer Neg Hx    Esophageal cancer Neg Hx    Rectal cancer Neg Hx    Stomach cancer Neg Hx     Social History   Socioeconomic History   Marital status: Married    Spouse name: Not on file   Number of children: Not on file   Years of education: Not on file   Highest education level: Not on file  Occupational History   Occupation: unemployed  Tobacco Use   Smoking status: Former    Current packs/day: 0.00    Average packs/day: 3.0 packs/day for 10.0 years (30.0 ttl pk-yrs)    Types: Cigarettes    Start date: 75    Quit date: 2004    Years since quitting: 21.1   Smokeless tobacco: Never  Vaping Use   Vaping status: Never Used  Substance and Sexual Activity   Alcohol use: Yes    Alcohol/week: 2.0 standard drinks of alcohol    Types: 2 Cans of beer per week    Comment: once a week/occ  Drug use: Never   Sexual activity: Yes    Partners: Female  Other Topics Concern   Not on file  Social History Narrative   Not on file   Social Drivers of Health   Financial Resource Strain: Not on file  Food Insecurity: Not on file  Transportation Needs: Not on file  Physical Activity: Not on file  Stress: Not on file  Social Connections: Not on file  Intimate Partner Violence: Not on file     Current Outpatient Medications:    pantoprazole (PROTONIX) 40 MG tablet, Take 1 tablet (40 mg total) by mouth daily., Disp: 90 tablet, Rfl: 1   urea (CARMOL) 40 % CREA, Apply small amount to both feet once or twice daily, Disp: 198.4 g, Rfl: 3   Allergies  Allergen Reactions   Aleve [Naproxen Sodium] Anaphylaxis    CONSTITUTIONAL: Negative for chills, fatigue, fever,  unintentional weight gain and unintentional weight loss.   CARDIOVASCULAR: Negative for chest pain, dizziness, palpitations and pedal edema.  RESPIRATORY: Negative for recent cough and dyspnea.  GASTROINTESTINAL: Negative for abdominal pain, acid reflux symptoms, constipation, diarrhea, nausea and vomiting.  MSK:pt complains of chronic foot pain from fallen arches and also arthritic pain in joints -  INTEGUMENTARY: Negative for rash.  NEUROLOGICAL: Negative for dizziness and headaches.  PSYCHIATRIC: Negative for sleep disturbance and to question depression screen.  Negative for depression, negative for anhedonia.        Objective:  PHYSICAL EXAM:   VS: BP 110/74 (BP Location: Left Arm, Patient Position: Sitting, Cuff Size: Large)   Pulse 85   Temp (!) 97.5 F (36.4 C) (Temporal)   Resp 18   Ht 6\' 2"  (1.88 m)   Wt 275 lb 6.4 oz (124.9 kg)   SpO2 98%   BMI 35.36 kg/m   GEN: Well nourished, well developed, in no acute distress   Cardiac: RRR; no murmurs, rubs, or gallops,no edema -  Respiratory:  normal respiratory rate and pattern with no distress - normal breath sounds with no rales, rhonchi, wheezes or rubs  MS: no deformity or atrophy  Skin: dry,cracked feet Neuro:  Alert and Oriented x 3, Strength and sensation are intact - CN II-Xii grossly intact Psych: euthymic mood, appropriate affect and demeanor Diabetic Foot Exam - Simple   Simple Foot Form Diabetic Foot exam was performed with the following findings: Yes 03/01/2024  9:30 AM  Visual Inspection See comments: Yes Sensation Testing Intact to touch and monofilament testing bilaterally: Yes Pulse Check Posterior Tibialis and Dorsalis pulse intact bilaterally: Yes Comments Patient feet is very dry and has thick layer over skin      There are no preventive care reminders to display for this patient.   There are no preventive care reminders to display for this patient.  Lab Results  Component Value Date   TSH  2.350 10/28/2023   Lab Results  Component Value Date   WBC 5.5 10/28/2023   HGB 14.9 10/28/2023   HCT 45.6 10/28/2023   MCV 89 10/28/2023   PLT 202 10/28/2023   Lab Results  Component Value Date   NA 140 10/28/2023   K 4.8 10/28/2023   CO2 24 10/28/2023   GLUCOSE 120 (H) 10/28/2023   BUN 13 10/28/2023   CREATININE 1.01 10/28/2023   BILITOT 0.6 10/28/2023   ALKPHOS 75 10/28/2023   AST 22 10/28/2023   ALT 26 10/28/2023   PROT 6.9 10/28/2023   ALBUMIN 4.6 10/28/2023   CALCIUM 9.1 10/28/2023  EGFR 86 10/28/2023   Lab Results  Component Value Date   CHOL 165 10/28/2023   Lab Results  Component Value Date   HDL 40 10/28/2023   Lab Results  Component Value Date   LDLCALC 109 (H) 10/28/2023   Lab Results  Component Value Date   TRIG 82 10/28/2023   Lab Results  Component Value Date   CHOLHDL 4.1 10/28/2023   Lab Results  Component Value Date   HGBA1C 6.2 (H) 10/28/2023      Assessment & Plan:   Problem List Items Addressed This Visit       Other   Mixed hyperlipidemia   Relevant Orders   Lipid panel Watch diet Will make recommendation about medication when labs result   Other Visit Diagnoses     Type 2 diabetes mellitus with hyperglycemia, without long-term current use of insulin (HCC)    -  Primary   Relevant Orders   CBC with Differential/Platelet   Comprehensive metabolic panel   Lipid panel   Hemoglobin A1c Watch diet  GERD Continue protonix   Dry cracked feet Rx for urea cream    Meds ordered this encounter  Medications   urea (CARMOL) 40 % CREA    Sig: Apply small amount to both feet once or twice daily    Dispense:  198.4 g    Refill:  3    Supervising Provider:   COX, Fritzi Mandes [[id number]]   pantoprazole (PROTONIX) 40 MG tablet    Sig: Take 1 tablet (40 mg total) by mouth daily.    Dispense:  90 tablet    Refill:  1    Supervising Provider:   Blane Ohara [id number]    Follow-up: Return in about 6 months (around 09/01/2024) for  fasting physical - 20 min.    SARA R Lamario Mani, PA-C

## 2024-03-02 ENCOUNTER — Telehealth: Payer: Self-pay

## 2024-03-02 LAB — CBC WITH DIFFERENTIAL/PLATELET
Basophils Absolute: 0.1 10*3/uL (ref 0.0–0.2)
Basos: 1 %
EOS (ABSOLUTE): 0.2 10*3/uL (ref 0.0–0.4)
Eos: 2 %
Hematocrit: 47.4 % (ref 37.5–51.0)
Hemoglobin: 15.8 g/dL (ref 13.0–17.7)
Immature Grans (Abs): 0 10*3/uL (ref 0.0–0.1)
Immature Granulocytes: 0 %
Lymphocytes Absolute: 1.8 10*3/uL (ref 0.7–3.1)
Lymphs: 26 %
MCH: 29.1 pg (ref 26.6–33.0)
MCHC: 33.3 g/dL (ref 31.5–35.7)
MCV: 87 fL (ref 79–97)
Monocytes Absolute: 0.4 10*3/uL (ref 0.1–0.9)
Monocytes: 6 %
Neutrophils Absolute: 4.4 10*3/uL (ref 1.4–7.0)
Neutrophils: 65 %
Platelets: 194 10*3/uL (ref 150–450)
RBC: 5.43 x10E6/uL (ref 4.14–5.80)
RDW: 13.5 % (ref 11.6–15.4)
WBC: 6.9 10*3/uL (ref 3.4–10.8)

## 2024-03-02 LAB — COMPREHENSIVE METABOLIC PANEL
ALT: 23 IU/L (ref 0–44)
AST: 23 IU/L (ref 0–40)
Albumin: 4.5 g/dL (ref 3.8–4.9)
Alkaline Phosphatase: 74 IU/L (ref 44–121)
BUN/Creatinine Ratio: 20 (ref 9–20)
BUN: 21 mg/dL (ref 6–24)
Bilirubin Total: 0.5 mg/dL (ref 0.0–1.2)
CO2: 21 mmol/L (ref 20–29)
Calcium: 9.7 mg/dL (ref 8.7–10.2)
Chloride: 102 mmol/L (ref 96–106)
Creatinine, Ser: 1.05 mg/dL (ref 0.76–1.27)
Globulin, Total: 2.2 g/dL (ref 1.5–4.5)
Glucose: 111 mg/dL — ABNORMAL HIGH (ref 70–99)
Potassium: 5 mmol/L (ref 3.5–5.2)
Sodium: 140 mmol/L (ref 134–144)
Total Protein: 6.7 g/dL (ref 6.0–8.5)
eGFR: 82 mL/min/{1.73_m2} (ref >59)

## 2024-03-02 LAB — HEMOGLOBIN A1C
Est. average glucose Bld gHb Est-mCnc: 126 mg/dL
Hgb A1c MFr Bld: 6 % — ABNORMAL HIGH (ref 4.8–5.6)

## 2024-03-02 LAB — LIPID PANEL
Chol/HDL Ratio: 4.7 ratio (ref 0.0–5.0)
Cholesterol, Total: 221 mg/dL — ABNORMAL HIGH (ref 100–199)
HDL: 47 mg/dL (ref >39)
LDL Chol Calc (NIH): 153 mg/dL — ABNORMAL HIGH (ref 0–99)
Triglycerides: 119 mg/dL (ref 0–149)
VLDL Cholesterol Cal: 21 mg/dL (ref 5–40)

## 2024-03-02 NOTE — Telephone Encounter (Signed)
 Copied from CRM [id number]. Topic: Clinical - Prescription Issue >> Mar 02, 2024  8:59 AM Joshua Crawford wrote: Reason for CRM: Patient states he was supposed to have a medicine for aches and pains sent over to the pharmacy that starts with an "M". Wants to know if he will be getting another medication sent over he was under the impression he had 3 to pick up

## 2024-03-03 ENCOUNTER — Encounter: Payer: Self-pay | Admitting: Family Medicine

## 2024-03-06 NOTE — Telephone Encounter (Signed)
 Called patient Per provider after looking at his allergies he had a Anaphylaxis  reaction to Aleve, so she could not give him maloxicam, patient stated he is taking ibuprofen but not helping with his pain, provider recommend he try over the counter Voltaren gel.

## 2024-03-13 ENCOUNTER — Other Ambulatory Visit: Payer: Self-pay | Admitting: Physician Assistant

## 2024-03-13 DIAGNOSIS — E782 Mixed hyperlipidemia: Secondary | ICD-10-CM

## 2024-03-13 MED ORDER — ATORVASTATIN CALCIUM 10 MG PO TABS: 10.0000 mg | ORAL_TABLET | Freq: Every day | ORAL | 0 refills | Status: DC

## 2024-06-08 ENCOUNTER — Other Ambulatory Visit: Payer: Self-pay

## 2024-06-08 DIAGNOSIS — E782 Mixed hyperlipidemia: Secondary | ICD-10-CM

## 2024-06-09 ENCOUNTER — Ambulatory Visit: Payer: Self-pay | Admitting: Physician Assistant

## 2024-06-09 ENCOUNTER — Other Ambulatory Visit: Payer: Self-pay | Admitting: Physician Assistant

## 2024-06-09 DIAGNOSIS — E782 Mixed hyperlipidemia: Secondary | ICD-10-CM

## 2024-06-09 LAB — COMPREHENSIVE METABOLIC PANEL WITH GFR
ALT: 27 IU/L (ref 0–44)
AST: 22 IU/L (ref 0–40)
Albumin: 4.5 g/dL (ref 3.8–4.9)
Alkaline Phosphatase: 69 IU/L (ref 44–121)
BUN/Creatinine Ratio: 13 (ref 9–20)
BUN: 13 mg/dL (ref 6–24)
Bilirubin Total: 0.6 mg/dL (ref 0.0–1.2)
CO2: 22 mmol/L (ref 20–29)
Calcium: 9.4 mg/dL (ref 8.7–10.2)
Chloride: 103 mmol/L (ref 96–106)
Creatinine, Ser: 1.02 mg/dL (ref 0.76–1.27)
Globulin, Total: 2.2 g/dL (ref 1.5–4.5)
Glucose: 125 mg/dL — ABNORMAL HIGH (ref 70–99)
Potassium: 5.3 mmol/L — ABNORMAL HIGH (ref 3.5–5.2)
Sodium: 140 mmol/L (ref 134–144)
Total Protein: 6.7 g/dL (ref 6.0–8.5)
eGFR: 85 mL/min/{1.73_m2} (ref >59)

## 2024-06-09 LAB — LIPID PANEL
Chol/HDL Ratio: 5.1 ratio — ABNORMAL HIGH (ref 0.0–5.0)
Cholesterol, Total: 194 mg/dL (ref 100–199)
HDL: 38 mg/dL — ABNORMAL LOW (ref >39)
LDL Chol Calc (NIH): 136 mg/dL — ABNORMAL HIGH (ref 0–99)
Triglycerides: 109 mg/dL (ref 0–149)
VLDL Cholesterol Cal: 20 mg/dL (ref 5–40)

## 2024-06-09 MED ORDER — ATORVASTATIN CALCIUM 20 MG PO TABS: 20.0000 mg | ORAL_TABLET | Freq: Every day | ORAL | 1 refills | Status: AC

## 2024-09-07 ENCOUNTER — Ambulatory Visit: Admitting: Podiatry

## 2024-09-07 ENCOUNTER — Ambulatory Visit (INDEPENDENT_AMBULATORY_CARE_PROVIDER_SITE_OTHER)

## 2024-09-07 DIAGNOSIS — M795 Residual foreign body in soft tissue: Secondary | ICD-10-CM | POA: Diagnosis not present

## 2024-09-07 DIAGNOSIS — S90852A Superficial foreign body, left foot, initial encounter: Secondary | ICD-10-CM

## 2024-09-07 NOTE — Progress Notes (Unsigned)
 Chief Complaint  Patient presents with   Foreign Body    He thinks he may still have glass left in his left foot from 3 weeks ago. Thought he had gotten it all out, but when he went golfing he felt a sharp pain so he is unsure if it just where he was diggin at it or if he missed something.  Not diabetic and no anti coag.    HPI: 58 y.o. male presents today with concern of possible broken glass in his foot.  States that he stepped on some broken glass a couple of weeks ago and his sister was able to get some of the glass out.  He feels like there is still some retained within the foot.  He has not sought prior treatment for this before today's appointment.  Past Medical History:  Diagnosis Date   Acute bilateral low back pain with sciatica 09/10/2022   Acute midline low back pain without sciatica 10/02/2020   BMI 35.0-35.9,adult 10/21/2020   Cataract    removed x 1 eye ? which    Decreased libido 06/04/2022   Dyspnea on exertion 06/19/2022   Elevated blood pressure reading in office without diagnosis of hypertension 06/19/2022   Hyperlipidemia    Impaired fasting glucose 10/08/2020   Leukocytosis 10/08/2020   Metatarsalgia of both feet 12/04/2022   Mixed hyperlipidemia 10/08/2020   Obesity (BMI 35.0-39.9 without comorbidity) 06/19/2022   Obesity, diabetes, and hypertension syndrome (HCC) 10/02/2020   Other fatigue 06/04/2022   Prostate cancer screening 04/24/2021   Routine general medical examination at a health care facility 10/21/2020   Routine medical exam 09/08/2022   Sleep apnea    doesnt wear cpap now - 01-28-21   Testosterone  deficiency 09/08/2022   Past Surgical History:  Procedure Laterality Date   CATARACT EXTRACTION     COLONOSCOPY  12/04/2015   Colonic polyp status post polypectomy. Small internal hemorrhoids. Hypoerplastic polyp.    Left Hand/Wrist Fracture w/ surgical repair     POLYPECTOMY     Right Knee Arthroscopy     Right Rotator Cuff Repair     UPPER  GASTROINTESTINAL ENDOSCOPY     Allergies  Allergen Reactions   Aleve [Naproxen Sodium] Anaphylaxis    Physical Exam: Palpable pedal pulses noted.  There is an entry point to the plantar aspect the left foot near the fourth metatarsal head.  There is pain with pressure to the area and palpable firmness within the soft tissue.  No active drainage is noted.  No clinical signs of infection are seen.  Epicritic sensations intact  Radiographic Exam (left foot, 3 PWB weightbearing views, 09/07/2024):  Normal osseous mineralization. No fractures noted.  No metal or radiodense artifact seen within the soft tissues of the plantar aspect of the foot.  Assessment/Plan of Care: 1. Foreign body in left foot, initial encounter   2. Residual foreign body in soft tissue    Discussed findings with the patient today.  3-WEA softening solution and topical anesthetic applied to the left submet 4 area.  A sterile #313 blade and splinter removal kit was utilized to slowly and carefully dissected through the skin and dermis to obtain 2 small broken pieces of glass from the subcutaneous tissue of the left foot.  No additional foreign body was noted.  The area was cleansed with saline.  Antibiotic ointment and a Band-Aid were applied.  Patient to continue applying antibiotic ointment over the next 2 to 3 days as well as perform Epsom salt  soaks for up to 15 minutes once daily to help keep the area from becoming infected.  He noted immediate improvement of pain upon weightbearing.   Awanda CHARM Imperial, DPM, FACFAS [facility]     [address] [place], [place] [address]                Office [phone]  Fax [phone]

## 2024-09-08 ENCOUNTER — Encounter: Admitting: Physician Assistant
